# Patient Record
Sex: Female | Born: 1960 | Race: Black or African American | Hispanic: No | Marital: Married | Smoking: Never smoker
Health system: Southern US, Community
[De-identification: ages and names within clinical notes are randomized; demographics above are authoritative.]

## PROBLEM LIST (undated history)

## (undated) DIAGNOSIS — I10 Essential (primary) hypertension: Secondary | ICD-10-CM

## (undated) DIAGNOSIS — Z87442 Personal history of urinary calculi: Secondary | ICD-10-CM

## (undated) DIAGNOSIS — D271 Benign neoplasm of left ovary: Secondary | ICD-10-CM

## (undated) DIAGNOSIS — R0982 Postnasal drip: Secondary | ICD-10-CM

## (undated) DIAGNOSIS — N200 Calculus of kidney: Secondary | ICD-10-CM

## (undated) DIAGNOSIS — J309 Allergic rhinitis, unspecified: Secondary | ICD-10-CM

## (undated) DIAGNOSIS — G510 Bell's palsy: Secondary | ICD-10-CM

## (undated) HISTORY — DX: Personal history of urinary calculi: Z87.442

## (undated) HISTORY — DX: Allergic rhinitis, unspecified: J30.9

## (undated) HISTORY — PX: ABDOMINAL HYSTERECTOMY: SHX81

## (undated) HISTORY — PX: AUGMENTATION MAMMAPLASTY: SUR837

## (undated) HISTORY — DX: Essential (primary) hypertension: I10

## (undated) HISTORY — DX: Postnasal drip: R09.82

## (undated) HISTORY — DX: Benign neoplasm of left ovary: D27.1

## (undated) HISTORY — DX: Bell's palsy: G51.0

---

## 2014-09-14 DIAGNOSIS — G510 Bell's palsy: Secondary | ICD-10-CM | POA: Insufficient documentation

## 2014-09-14 DIAGNOSIS — Z8669 Personal history of other diseases of the nervous system and sense organs: Secondary | ICD-10-CM | POA: Insufficient documentation

## 2014-09-14 HISTORY — DX: Bell's palsy: G51.0

## 2015-06-22 ENCOUNTER — Ambulatory Visit
Admission: RE | Admit: 2015-06-22 | Discharge: 2015-06-22 | Disposition: A | Discharge status: Home / Self Care | Payer: 59 | Source: Ambulatory Visit | Attending: Family Medicine | Admitting: Family Medicine

## 2015-06-22 ENCOUNTER — Ambulatory Visit (INDEPENDENT_AMBULATORY_CARE_PROVIDER_SITE_OTHER): Payer: 59 | Admitting: Family Medicine

## 2015-06-22 ENCOUNTER — Encounter: Payer: Self-pay | Admitting: Family Medicine

## 2015-06-22 VITALS — BP 122/78 | HR 83 | Temp 98.2°F | Resp 16 | Ht 67.5 in | Wt 133.4 lb

## 2015-06-22 DIAGNOSIS — M79672 Pain in left foot: Secondary | ICD-10-CM

## 2015-06-22 DIAGNOSIS — M79671 Pain in right foot: Secondary | ICD-10-CM

## 2015-06-22 DIAGNOSIS — R0982 Postnasal drip: Secondary | ICD-10-CM

## 2015-06-22 DIAGNOSIS — D279 Benign neoplasm of unspecified ovary: Secondary | ICD-10-CM | POA: Insufficient documentation

## 2015-06-22 DIAGNOSIS — I10 Essential (primary) hypertension: Secondary | ICD-10-CM | POA: Diagnosis not present

## 2015-06-22 DIAGNOSIS — Z87442 Personal history of urinary calculi: Secondary | ICD-10-CM | POA: Insufficient documentation

## 2015-06-22 DIAGNOSIS — J309 Allergic rhinitis, unspecified: Secondary | ICD-10-CM | POA: Insufficient documentation

## 2015-06-22 HISTORY — DX: Essential (primary) hypertension: I10

## 2015-06-22 HISTORY — DX: Benign neoplasm of unspecified ovary: D27.9

## 2015-06-22 MED ORDER — IBUPROFEN 800 MG PO TABS: 800.0000 mg | ORAL_TABLET | Freq: Three times a day (TID) | ORAL | Status: DC | PRN

## 2015-06-22 MED ORDER — TRAMADOL HCL 50 MG PO TABS: 50.0000 mg | ORAL_TABLET | Freq: Two times a day (BID) | ORAL | Status: DC | PRN

## 2015-06-22 NOTE — Patient Instructions (Signed)
Morton's Neuroma in Sports  (Interdigital Plantar Neuroma) Morton's neuroma is a condition of the nervous system that results in pain or loss of feeling in the toes. The disease is caused by the bones of the foot squeezing the nerve that runs between two toes (interdigital nerve). The third and fourth toes are most likely to be affected by this disease. SYMPTOMS   Tingling, numbness, burning, or electric shocks in the front of the foot, often involving the third and fourth toes, although it may involve any other pair of toes.  Pain and tenderness in the front of the foot, that gets worse when walking.  Pain that gets worse when pressure is applied to the foot (wearing shoes).  Severe pain in the front of the foot, when standing on the front of the foot (on tiptoes), such as with running, jumping, pivoting, or dancing. CAUSES  Morton's neuroma is caused by swelling of the nerve between two toes. This swelling causes the nerve to be pinched between the bones of the foot. RISK INCREASES WITH:  Recurring foot or ankle injuries.  Poor fitting or worn shoes, with minimal padding and shock absorbers.  Loose ligaments of the foot, causing thickening of the nerve.  Poor foot strength and flexibility. PREVENTION  Warm up and stretch properly before activity.  Maintain physical fitness:  Foot and ankle flexibility.  Muscle strength and endurance.  Cardiovascular fitness.  Wear properly fitted and padded shoes.  Wear arch supports (orthotics), when needed. PROGNOSIS  If treated properly, Morton's neuroma can usually be cured with non-surgical treatment. For certain cases, surgery may be needed. RELATED COMPLICATIONS  Permanent numbness and pain in the foot.  Inability to participate in athletics, because of pain. TREATMENT Treatment first involves stopping any activities that make the symptoms worse. The use of ice and medicine will help reduce pain and inflammation. Wearing shoes  with a wide toe box, and an orthotic arch support or metatarsal bar, may also reduce pain. Your caregiver may give you a corticosteroid injection, to further reduce inflammation. If non-surgical treatment is unsuccessful, surgery may be needed. Surgery to fix Morton's neuroma is often performed as an outpatient procedure, meaning you can go home the same day as the surgery. The procedure involves removing the source of pressure on the nerve. If it is necessary to remove the nerve, you can expect persistent numbness. MEDICATION  If pain medicine is needed, nonsteroidal anti-inflammatory medicines (aspirin and ibuprofen), or other minor pain relievers (acetaminophen), are often advised.  Do not take pain medicine for 7 days before surgery.  Prescription pain relievers are usually prescribed only after surgery. Use only as directed and only as much as you need.  Corticosteroid injections are used in extreme cases, to reduce inflammation. These injections should be done only if necessary, because they may be given only a limited number of times. HEAT AND COLD  Cold treatment (icing) should be applied for 10 to 15 minutes every 2 to 3 hours for inflammation and pain, and immediately after activity that aggravates your symptoms. Use ice packs or an ice massage.  Heat treatment may be used before performing stretching and strengthening activities prescribed by your caregiver, physical therapist, or athletic trainer. Use a heat pack or a warm water soak. SEEK MEDICAL CARE IF:   Symptoms get worse or do not improve in 2 weeks, despite treatment.  After surgery you develop increasing pain, swelling, redness, increased warmth, bleeding, drainage of fluids, or fever.  New, unexplained symptoms develop. (  Drugs used in treatment may produce side effects.) Document Released: 08/28/2005 Document Revised: 01/13/2012 Document Reviewed: 02/02/2009 [employer] Patient Information 2015 [employer]. This  information is not intended to replace advice given to you by your health care provider. Make sure you discuss any questions you have with your health care provider.  

## 2015-06-22 NOTE — Progress Notes (Signed)
Name: Teresa Dodson   MRN: [id number]    DOB: November 27, 1960   Date:06/22/2015       Progress Note  Subjective  Chief Complaint  Chief Complaint  Patient presents with  . Foot Pain    patient stated that she has bilateral foot pain that has been goin on for quite some time. She stated it feels like something is stabbing her in the ball of her foot. Patient has not taken anything for the pain.    HPI  Patient is here for routine follow up of Hypertension. First diagnosed with hypertension several years ago. Current anti-hypertension medication regimen includes dietary modification, weight management and Lotrel 5-10 mg one a day. Patient is following physician recommended management. Not checking blood pressure outside of physician office. Results average systolic unknown and average diastolic unknown. Associated symptoms do not include headache, dizziness, nausea, lower extremity swelling, shortness of breath, chest pain, numbness.  Joint/Muscle Pain: Patient complains of bilateral foot pain for which has been present for several months. Pain is located in base of both feet at the balls of the feet, is described as aching and sharp, and is intermittent .  Associated symptoms include: none.  The patient has tried nothing for pain relief.  Related to injury:   No.  She wears supportive slippers or flats mostly, no high heels. Has not changed activity recently but overall keeps health and is active.    Past Medical History  Diagnosis Date  . Cystadenofibroma of left ovary   . Left-sided Bell's palsy   . HTN, goal below 140/90   . Allergic rhinitis with postnasal drip   . History of nephrolithiasis     Past Surgical History  Procedure Laterality Date  . Abdominal hysterectomy      vaginal; partial    Family History  Problem Relation Age of Onset  . Alzheimer's disease Mother   . Cancer Father     lung  . Diabetes Paternal Grandmother     Social History   Social History  .  Marital Status: Married    Spouse Name: N/A  . Number of Children: N/A  . Years of Education: N/A   Occupational History  . Not on file.   Social History Main Topics  . Smoking status: Never Smoker   . Smokeless tobacco: Not on file  . Alcohol Use: No  . Drug Use: No  . Sexual Activity:    Partners: Male   Other Topics Concern  . Not on file   Social History Narrative  . No narrative on file     Current outpatient prescriptions:  .  amLODipine-benazepril (LOTREL) 5-10 MG per capsule, TK 1 C PO D, Disp: , Rfl: 1 .  ibuprofen (ADVIL,MOTRIN) 800 MG tablet, Take 1 tablet (800 mg total) by mouth every 8 (eight) hours as needed., Disp: 30 tablet, Rfl: 1 .  traMADol (ULTRAM) 50 MG tablet, Take 1 tablet (50 mg total) by mouth every 12 (twelve) hours as needed for severe pain., Disp: 30 tablet, Rfl: 0  Allergies  Allergen Reactions  . Sulfamethoxazole-Trimethoprim Rash     ROS  CONSTITUTIONAL: No significant weight changes, fever, chills, weakness or fatigue.  HEENT:  - Eyes: No visual changes.  - Ears: No auditory changes. No pain.  - Nose: No sneezing, congestion, runny nose. - Throat: No sore throat. No changes in swallowing. SKIN: No rash or itching.  CARDIOVASCULAR: No chest pain, chest pressure or chest discomfort. No palpitations or edema.  RESPIRATORY:  No shortness of breath, cough or sputum.  GASTROINTESTINAL: No anorexia, nausea, vomiting. No changes in bowel habits. No abdominal pain or blood.  GENITOURINARY: No dysuria. No frequency. No discharge.  NEUROLOGICAL: No headache, dizziness, syncope, paralysis, ataxia, numbness or tingling in the extremities. No memory changes. No change in bowel or bladder control.  MUSCULOSKELETAL: Yes foot pain. No muscle pain. HEMATOLOGIC: No anemia, bleeding or bruising.  LYMPHATICS: No enlarged lymph nodes.  PSYCHIATRIC: No change in mood. No change in sleep pattern.  ENDOCRINOLOGIC: No reports of sweating, cold or heat  intolerance. No polyuria or polydipsia.   Objective  Filed Vitals:   06/22/15 1350  BP: 122/78  Pulse: 83  Temp: 98.2 F (36.8 C)  TempSrc: Oral  Resp: 16  Height: 5' 7.5" (1.715 m)  Weight: 133 lb 6.4 oz (60.51 kg)  SpO2: 98%   Body mass index is 20.57 kg/(m^2).  Physical Exam  Constitutional: Patient appears well-developed and well-nourished. In no distress.  HEENT:  - Head: Normocephalic and atraumatic.  - Ears: Bilateral TMs gray, no erythema or effusion - Nose: Nasal mucosa moist - Mouth/Throat: Oropharynx is clear and moist. No tonsillar hypertrophy or erythema. No post nasal drainage.  - Eyes: Conjunctivae clear, EOM movements normal. PERRLA. No scleral icterus.  Neck: Normal range of motion. Neck supple. No JVD present. No thyromegaly present.  Cardiovascular: Normal rate, regular rhythm and normal heart sounds.  No murmur heard.  Pulmonary/Chest: Effort normal and breath sounds normal. No respiratory distress. Musculoskeletal: Normal range of motion bilateral UE and LE, no joint effusions. Bilateral feet inspected, good tone, good arch support, plantar flexion and dorsi flexion and eversion and inversion intact with no pain, no pain at balls of feet. Pain reproduced at deep palpation in between 2nd and 3rd metatarsals however no masses or cysts felt.  Peripheral vascular: Bilateral LE no edema. Neurological: CN II-XII grossly intact with no focal deficits. Alert and oriented to person, place, and time. Coordination, balance, strength, speech and gait are normal.  Skin: Skin is warm and dry. No rash noted. No erythema.  Psychiatric: Patient has a normal mood and affect. Behavior is normal in office today. Judgment and thought content normal in office today.   Assessment & Plan  1. Hypertension, goal below 140/90 Clinically stable findings based on clinical exam and on review of any pertinent results. Recommended to patient that they continue their current regimen with  regular follow ups.   2. 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 Will get bilateral x-rays and instructed patients on home care, pdf and information provided on exercises.   - ibuprofen (ADVIL,MOTRIN) 800 MG tablet; Take 1 tablet (800 mg total) by mouth every 8 (eight) hours as needed.  Dispense: 30 tablet; Refill: 1 - traMADol (ULTRAM) 50 MG tablet; Take 1 tablet (50 mg total) by mouth every 12 (twelve) hours as needed for severe pain.  Dispense: 30 tablet; Refill: 0 - DG Foot Complete Left; Future - DG Foot Complete Right; Future

## 2015-08-25 ENCOUNTER — Telehealth: Payer: Self-pay | Admitting: Family Medicine

## 2015-08-25 NOTE — Telephone Encounter (Signed)
Pt is wanting to know if she can take 800 mg of tylenol. She is a pt of [facility] and she went to the urgent care and they told her that she has Calcium in her Rotar Cuff (shoulder) and the pain meds that they gave her are not working.

## 2015-08-25 NOTE — Telephone Encounter (Signed)
Spoke with pt

## 2015-08-30 ENCOUNTER — Telehealth: Payer: Self-pay | Admitting: Family Medicine

## 2015-08-30 NOTE — Telephone Encounter (Signed)
Patient went to urgent care and was told that she had calcific rotator cuff. She was prescribed prednisone and tramadol. Would like to know if you would like for her to schedule an appointment with you or if you would like for her to finish off the medication and then schedule appointment. Please advise.

## 2015-08-30 NOTE — Telephone Encounter (Signed)
Yes finish medication and follow up if symptoms persist.

## 2015-08-31 NOTE — Telephone Encounter (Signed)
Message was relayed to patient for her to finish the medication and then make appointment with dr Nadine Counts if it is not better. Patient understood.

## 2015-10-09 ENCOUNTER — Other Ambulatory Visit: Payer: Self-pay

## 2015-10-09 MED ORDER — AMLODIPINE BESY-BENAZEPRIL HCL 5-10 MG PO CAPS: 1.0000 | ORAL_CAPSULE | Freq: Every day | ORAL | Status: DC

## 2015-10-09 NOTE — Telephone Encounter (Signed)
Got a fax from [facility] on [address]. Requesting a refill of this patient's Lotrel, #90.   Refill request was sent to Dr. Bobetta Lime for approval and submission.

## 2016-01-03 ENCOUNTER — Encounter: Payer: Self-pay | Admitting: Family Medicine

## 2016-01-03 ENCOUNTER — Ambulatory Visit (INDEPENDENT_AMBULATORY_CARE_PROVIDER_SITE_OTHER): Payer: BC Managed Care – PPO | Admitting: Family Medicine

## 2016-01-03 VITALS — BP 118/68 | HR 71 | Temp 98.2°F | Resp 18 | Ht 68.0 in | Wt 134.5 lb

## 2016-01-03 DIAGNOSIS — I10 Essential (primary) hypertension: Secondary | ICD-10-CM | POA: Diagnosis not present

## 2016-01-03 DIAGNOSIS — Z124 Encounter for screening for malignant neoplasm of cervix: Secondary | ICD-10-CM | POA: Diagnosis not present

## 2016-01-03 DIAGNOSIS — Z9882 Breast implant status: Secondary | ICD-10-CM

## 2016-01-03 DIAGNOSIS — Z1231 Encounter for screening mammogram for malignant neoplasm of breast: Secondary | ICD-10-CM | POA: Diagnosis not present

## 2016-01-03 DIAGNOSIS — Z136 Encounter for screening for cardiovascular disorders: Secondary | ICD-10-CM

## 2016-01-03 DIAGNOSIS — Z13220 Encounter for screening for lipoid disorders: Secondary | ICD-10-CM | POA: Diagnosis not present

## 2016-01-03 DIAGNOSIS — Z0000 Encounter for general adult medical examination without abnormal findings: Secondary | ICD-10-CM | POA: Diagnosis not present

## 2016-01-03 DIAGNOSIS — Z23 Encounter for immunization: Secondary | ICD-10-CM | POA: Insufficient documentation

## 2016-01-03 DIAGNOSIS — Z1211 Encounter for screening for malignant neoplasm of colon: Secondary | ICD-10-CM | POA: Insufficient documentation

## 2016-01-03 DIAGNOSIS — IMO0001 Reserved for inherently not codable concepts without codable children: Secondary | ICD-10-CM | POA: Insufficient documentation

## 2016-01-03 DIAGNOSIS — Z113 Encounter for screening for infections with a predominantly sexual mode of transmission: Secondary | ICD-10-CM

## 2016-01-03 HISTORY — DX: Breast implant status: Z98.82

## 2016-01-03 NOTE — Progress Notes (Signed)
Name: Teresa Dodson   MRN: [id number]    DOB: [birth year]   Date:01/03/2016       Progress Note  Subjective  Chief Complaint  Chief Complaint  Patient presents with  . Annual Exam    HPI  Patient is here today for a Complete Female Physical Exam:  The patient has has no unusual complaints. Overall feels healthy. Diet is well balanced. In general does exercise regularly. Sees dentist regularly and addresses vision concerns with ophthalmologist if applicable. In regards to sexual activity the patient is currently sexually active. Currently is not concerned about exposure to any STDs.   First diagnosed with hypertension several years ago. Current anti-hypertension medication regimen includes dietary modification, weight management and Lotrel 5-10 mg one a day. Patient is following physician recommended management. Not checking blood pressure outside of physician office. Results average systolic unknown and average diastolic unknown. Associated symptoms do not include headache, dizziness, nausea, lower extremity swelling, shortness of breath, chest pain, numbness.  Has residual left facial weakness from Bell's palsy flare up some years ago. Noted some improvement in sensation and less droop in the area.   Past Medical History  Diagnosis Date  . Cystadenofibroma of left ovary   . Left-sided Bell's palsy   . HTN, goal below 140/90   . Allergic rhinitis with postnasal drip   . History of nephrolithiasis     Past Surgical History  Procedure Laterality Date  . Abdominal hysterectomy      vaginal; partial    Family History  Problem Relation Age of Onset  . Alzheimer's disease Mother   . Cancer Father     lung  . Diabetes Paternal Grandmother     Social History   Social History  . Marital Status: Married    Spouse Name: N/A  . Number of Children: N/A  . Years of Education: N/A   Occupational History  . Not on file.   Social History Main Topics  . Smoking status: Never  Smoker   . Smokeless tobacco: Not on file  . Alcohol Use: No  . Drug Use: No  . Sexual Activity:    Partners: Male   Other Topics Concern  . Not on file   Social History Narrative     Current outpatient prescriptions:  .  amLODipine-benazepril (LOTREL) 5-10 MG capsule, Take 1 capsule by mouth daily., Disp: 90 capsule, Rfl: 2 .  ibuprofen (ADVIL,MOTRIN) 800 MG tablet, Take 1 tablet (800 mg total) by mouth every 8 (eight) hours as needed., Disp: 30 tablet, Rfl: 1  Allergies  Allergen Reactions  . Sulfamethoxazole-Trimethoprim Rash    ROS  CONSTITUTIONAL: No significant weight changes, fever, chills, weakness or fatigue.  HEENT:  - Eyes: No visual changes.  - Ears: No auditory changes. No pain.  - Nose: No sneezing, congestion, runny nose. - Throat: No sore throat. No changes in swallowing. SKIN: No rash or itching.  CARDIOVASCULAR: No chest pain, chest pressure or chest discomfort. No palpitations or edema.  RESPIRATORY: No shortness of breath, cough or sputum.  GASTROINTESTINAL: No anorexia, nausea, vomiting. No changes in bowel habits. No abdominal pain or blood.  GENITOURINARY: No dysuria. No frequency. No discharge.  NEUROLOGICAL: No headache, dizziness, syncope, paralysis, ataxia, numbness or tingling in the extremities. No memory changes. No change in bowel or bladder control.  MUSCULOSKELETAL: No joint pain. No muscle pain. HEMATOLOGIC: No anemia, bleeding or bruising.  LYMPHATICS: No enlarged lymph nodes.  PSYCHIATRIC: No change in mood. No change in sleep  pattern.  ENDOCRINOLOGIC: No reports of sweating, cold or heat intolerance. No polyuria or polydipsia.   Objective  Filed Vitals:   01/03/16 1046  BP: 118/68  Pulse: 71  Temp: 98.2 F (36.8 C)  Resp: 18  Height: 5\' 8"  (1.727 m)  Weight: 134 lb 8 oz (61.009 kg)  SpO2: 98%   Body mass index is 20.46 kg/(m^2).  Depression screen [facility] 2/9 01/03/2016 06/22/2015  Decreased Interest 0 0  Down, Depressed,  Hopeless 0 0  PHQ - 2 Score 0 0     Physical Exam  Constitutional: Patient appears well-developed and well-nourished. In no distress.  HEENT:  - Head: Normocephalic and atraumatic. Left facial droop around eyebrow and upper cheek only notable when large smile.  - Ears: Bilateral TMs gray, no erythema or effusion - Nose: Nasal mucosa moist - Mouth/Throat: Oropharynx is clear and moist. No tonsillar hypertrophy or erythema. No post nasal drainage.  - Eyes: Conjunctivae clear, EOM movements normal. PERRLA. No scleral icterus.  Neck: Normal range of motion. Neck supple. No JVD present. No thyromegaly present.  Cardiovascular: Normal rate, regular rhythm and normal heart sounds.  No murmur heard.  Pulmonary/Chest: Effort normal and breath sounds normal. No respiratory distress. Abdominal: Soft. Bowel sounds are normal, no distension. There is no tenderness. no masses BREAST: Bilateral breast exam, notable for saline implants, with no masses, skin changes or nipple discharge FEMALE GENITALIA:  External genitalia normal External urethra normal Vaginal vault normal without discharge or lesions Cervix normal without discharge or lesions Bimanual exam normal without masses RECTAL: no rectal masses or hemorrhoids Musculoskeletal: Normal range of motion bilateral UE and LE, no joint effusions. Peripheral vascular: Bilateral LE no edema. Neurological: CN II-XII grossly intact with no focal deficits other than Left facial droop around eyebrow and upper cheek only notable when large smile (slight improved from previous year). Alert and oriented to person, place, and time. Coordination, balance, strength, speech and gait are normal.  Skin: Skin is warm and dry. No rash noted. No erythema.  Psychiatric: Patient has a normal mood and affect. Behavior is normal in office today. Judgment and thought content normal in office today.   Assessment & Plan  1. Annual physical exam Discussed in detail all  recommended preventative measures appropriate for age and gender now and in the future.   2. Need for influenza vaccination  - Flu Vaccine QUAD 36+ mos PF IM (Fluarix & Fluzone Quad PF)  3. Hypertension, goal below 140/90 Well controled, should have enough refills from previous refill.  - CBC with Differential/Platelet - Comprehensive metabolic panel - Lipid panel - TSH  4. Encounter for cholesteral screening for cardiovascular disease   5. Screening for STD (sexually transmitted disease)  - HIV antibody - Hepatitis C antibody  6. Encounter for screening mammogram for malignant neoplasm of breast  - MM Digital Screening; Future  7. Encounter for screening for malignant neoplasm of cervix  - Pap IG w/ reflex to HPV when ASC-U  8. Encounter for screening for malignant neoplasm of colon She opted for cologuard  - Cologuard  9. History of bilateral saline breast implants No complications

## 2016-01-04 LAB — CBC WITH DIFFERENTIAL/PLATELET
BASOS ABS: 0 10*3/uL (ref 0.0–0.2)
Basos: 0 %
EOS (ABSOLUTE): 0.1 10*3/uL (ref 0.0–0.4)
EOS: 2 %
HEMOGLOBIN: 14.3 g/dL (ref 11.1–15.9)
Hematocrit: 42.3 % (ref 34.0–46.6)
IMMATURE GRANS (ABS): 0 10*3/uL (ref 0.0–0.1)
IMMATURE GRANULOCYTES: 0 %
LYMPHS: 33 %
Lymphocytes Absolute: 1.4 10*3/uL (ref 0.7–3.1)
MCH: 30.1 pg (ref 26.6–33.0)
MCHC: 33.8 g/dL (ref 31.5–35.7)
MCV: 89 fL (ref 79–97)
MONOCYTES: 7 %
Monocytes Absolute: 0.3 10*3/uL (ref 0.1–0.9)
Neutrophils Absolute: 2.4 10*3/uL (ref 1.4–7.0)
Neutrophils: 58 %
Platelets: 207 10*3/uL (ref 150–379)
RBC: 4.75 x10E6/uL (ref 3.77–5.28)
RDW: 13.1 % (ref 12.3–15.4)
WBC: 4.1 10*3/uL (ref 3.4–10.8)

## 2016-01-04 LAB — COMPREHENSIVE METABOLIC PANEL
ALBUMIN: 4.7 g/dL (ref 3.5–5.5)
ALK PHOS: 55 IU/L (ref 39–117)
ALT: 14 IU/L (ref 0–32)
AST: 22 IU/L (ref 0–40)
Albumin/Globulin Ratio: 1.7 (ref 1.1–2.5)
BUN / CREAT RATIO: 16 (ref 9–23)
BUN: 15 mg/dL (ref 6–24)
Bilirubin Total: 0.6 mg/dL (ref 0.0–1.2)
CALCIUM: 9.6 mg/dL (ref 8.7–10.2)
CO2: 28 mmol/L (ref 18–29)
CREATININE: 0.93 mg/dL (ref 0.57–1.00)
Chloride: 100 mmol/L (ref 96–106)
GFR calc Af Amer: 80 mL/min/{1.73_m2} (ref >59)
GFR, EST NON AFRICAN AMERICAN: 69 mL/min/{1.73_m2} (ref >59)
GLUCOSE: 84 mg/dL (ref 65–99)
Globulin, Total: 2.8 g/dL (ref 1.5–4.5)
Potassium: 4.3 mmol/L (ref 3.5–5.2)
Sodium: 140 mmol/L (ref 134–144)
TOTAL PROTEIN: 7.5 g/dL (ref 6.0–8.5)

## 2016-01-04 LAB — LIPID PANEL
CHOLESTEROL TOTAL: 221 mg/dL — AB (ref 100–199)
Chol/HDL Ratio: 2.4 ratio units (ref 0.0–4.4)
HDL: 91 mg/dL (ref >39)
LDL CALC: 117 mg/dL — AB (ref 0–99)
Triglycerides: 64 mg/dL (ref 0–149)
VLDL CHOLESTEROL CAL: 13 mg/dL (ref 5–40)

## 2016-01-04 LAB — TSH: TSH: 1.67 u[IU]/mL (ref 0.450–4.500)

## 2016-01-04 LAB — HIV ANTIBODY (ROUTINE TESTING W REFLEX): HIV SCREEN 4TH GENERATION: NONREACTIVE

## 2016-01-04 LAB — HEPATITIS C ANTIBODY: Hep C Virus Ab: <0.1 s/co ratio (ref 0.0–0.9)

## 2016-01-06 LAB — PAP IG W/ RFLX HPV ASCU: PAP Smear Comment: 0

## 2016-01-19 ENCOUNTER — Telehealth: Payer: Self-pay | Admitting: Family Medicine

## 2016-01-19 NOTE — Telephone Encounter (Signed)
Received fax on 01/17/16 from [facility] stating that patient refused to complete the cologuard screening.  Therefore the order has been canceled.  If, at a later date, Teresa Dodson returns her kit, [facility] will reactivate this order.  The order can be reactivated within 365 of the initial order. °

## 2016-08-12 ENCOUNTER — Other Ambulatory Visit: Payer: Self-pay

## 2016-08-12 MED ORDER — AMLODIPINE BESY-BENAZEPRIL HCL 5-10 MG PO CAPS: 1.0000 | ORAL_CAPSULE | Freq: Every day | ORAL | 1 refills | Status: DC

## 2016-08-12 NOTE — Telephone Encounter (Signed)
Last Cr and K+ reviewed; Rx approved 

## 2016-08-19 ENCOUNTER — Ambulatory Visit (INDEPENDENT_AMBULATORY_CARE_PROVIDER_SITE_OTHER): Payer: PRIVATE HEALTH INSURANCE

## 2016-08-19 DIAGNOSIS — Z23 Encounter for immunization: Secondary | ICD-10-CM

## 2016-09-21 ENCOUNTER — Emergency Department: Payer: PRIVATE HEALTH INSURANCE

## 2016-09-21 ENCOUNTER — Emergency Department
Admission: EM | Admit: 2016-09-21 | Discharge: 2016-09-21 | Disposition: A | Discharge status: Home / Self Care | Payer: PRIVATE HEALTH INSURANCE | Attending: Emergency Medicine | Admitting: Emergency Medicine

## 2016-09-21 DIAGNOSIS — I10 Essential (primary) hypertension: Secondary | ICD-10-CM | POA: Insufficient documentation

## 2016-09-21 DIAGNOSIS — Z791 Long term (current) use of non-steroidal anti-inflammatories (NSAID): Secondary | ICD-10-CM | POA: Diagnosis not present

## 2016-09-21 DIAGNOSIS — N202 Calculus of kidney with calculus of ureter: Secondary | ICD-10-CM | POA: Diagnosis not present

## 2016-09-21 DIAGNOSIS — R109 Unspecified abdominal pain: Secondary | ICD-10-CM | POA: Diagnosis present

## 2016-09-21 DIAGNOSIS — Z79899 Other long term (current) drug therapy: Secondary | ICD-10-CM | POA: Insufficient documentation

## 2016-09-21 DIAGNOSIS — N200 Calculus of kidney: Secondary | ICD-10-CM

## 2016-09-21 DIAGNOSIS — N201 Calculus of ureter: Secondary | ICD-10-CM

## 2016-09-21 HISTORY — DX: Calculus of kidney: N20.0

## 2016-09-21 LAB — URINALYSIS COMPLETE WITH MICROSCOPIC (ARMC ONLY)
BILIRUBIN URINE: NEGATIVE
GLUCOSE, UA: 50 mg/dL — AB
Hgb urine dipstick: NEGATIVE
KETONES UR: NEGATIVE mg/dL
Leukocytes, UA: NEGATIVE
NITRITE: NEGATIVE
Protein, ur: NEGATIVE mg/dL
SPECIFIC GRAVITY, URINE: 1.009 (ref 1.005–1.030)
pH: 9 — ABNORMAL HIGH (ref 5.0–8.0)

## 2016-09-21 LAB — COMPREHENSIVE METABOLIC PANEL
ALBUMIN: 4.6 g/dL (ref 3.5–5.0)
ALK PHOS: 51 U/L (ref 38–126)
ALT: 25 U/L (ref 14–54)
ANION GAP: 7 (ref 5–15)
AST: 33 U/L (ref 15–41)
BILIRUBIN TOTAL: 0.5 mg/dL (ref 0.3–1.2)
BUN: 18 mg/dL (ref 6–20)
CALCIUM: 9.5 mg/dL (ref 8.9–10.3)
CO2: 29 mmol/L (ref 22–32)
CREATININE: 1.1 mg/dL — AB (ref 0.44–1.00)
Chloride: 105 mmol/L (ref 101–111)
GFR calc non Af Amer: 55 mL/min — ABNORMAL LOW (ref >60)
GLUCOSE: 158 mg/dL — AB (ref 65–99)
Potassium: 3.9 mmol/L (ref 3.5–5.1)
SODIUM: 141 mmol/L (ref 135–145)
TOTAL PROTEIN: 7.8 g/dL (ref 6.5–8.1)

## 2016-09-21 LAB — CBC
HCT: 42.9 % (ref 35.0–47.0)
HEMOGLOBIN: 14.4 g/dL (ref 12.0–16.0)
MCH: 29.9 pg (ref 26.0–34.0)
MCHC: 33.5 g/dL (ref 32.0–36.0)
MCV: 89.1 fL (ref 80.0–100.0)
PLATELETS: 241 10*3/uL (ref 150–440)
RBC: 4.82 MIL/uL (ref 3.80–5.20)
RDW: 13.1 % (ref 11.5–14.5)
WBC: 12.2 10*3/uL — ABNORMAL HIGH (ref 3.6–11.0)

## 2016-09-21 LAB — LIPASE, BLOOD: Lipase: 21 U/L (ref 11–51)

## 2016-09-21 MED ORDER — ONDANSETRON HCL 4 MG/2ML IJ SOLN
INTRAMUSCULAR | Status: AC
  Administered 2016-09-21: 4 mg via INTRAVENOUS
  Filled 2016-09-21: qty 2

## 2016-09-21 MED ORDER — NAPROXEN 500 MG PO TABS: 500.0000 mg | ORAL_TABLET | Freq: Two times a day (BID) | ORAL | 0 refills | Status: DC

## 2016-09-21 MED ORDER — MORPHINE SULFATE (PF) 4 MG/ML IV SOLN
4.0000 mg | Freq: Once | INTRAVENOUS | Status: AC
  Administered 2016-09-21: 4 mg via INTRAVENOUS

## 2016-09-21 MED ORDER — ONDANSETRON HCL 4 MG/2ML IJ SOLN
4.0000 mg | Freq: Once | INTRAMUSCULAR | Status: AC
  Administered 2016-09-21: 4 mg via INTRAVENOUS
  Filled 2016-09-21: qty 2

## 2016-09-21 MED ORDER — OXYCODONE-ACETAMINOPHEN 5-325 MG PO TABS: 1.0000 | ORAL_TABLET | Freq: Four times a day (QID) | ORAL | 0 refills | Status: DC | PRN

## 2016-09-21 MED ORDER — ONDANSETRON HCL 4 MG/2ML IJ SOLN
4.0000 mg | Freq: Once | INTRAMUSCULAR | Status: AC
  Administered 2016-09-21: 4 mg via INTRAVENOUS

## 2016-09-21 MED ORDER — ONDANSETRON 4 MG PO TBDP: 4.0000 mg | ORAL_TABLET | Freq: Three times a day (TID) | ORAL | 0 refills | Status: DC | PRN

## 2016-09-21 MED ORDER — MORPHINE SULFATE (PF) 4 MG/ML IV SOLN
INTRAVENOUS | Status: AC
  Administered 2016-09-21: 4 mg via INTRAVENOUS
  Filled 2016-09-21: qty 1

## 2016-09-21 MED ORDER — FENTANYL CITRATE (PF) 100 MCG/2ML IJ SOLN
50.0000 ug | INTRAMUSCULAR | Status: DC | PRN
  Administered 2016-09-21: 50 ug via INTRAVENOUS
  Filled 2016-09-21: qty 2

## 2016-09-21 MED ORDER — OXYCODONE-ACETAMINOPHEN 5-325 MG PO TABS
1.0000 | ORAL_TABLET | Freq: Once | ORAL | Status: AC
Start: 2016-09-21 — End: 2016-09-21
  Administered 2016-09-21: 1 via ORAL
  Filled 2016-09-21: qty 1

## 2016-09-21 NOTE — ED Triage Notes (Signed)
Right sided flank pain and urinary urgency 1 hour PTA. Pt holding right flank.

## 2016-09-21 NOTE — Discharge Instructions (Signed)
Your CT scan shows a 53mm kidney stone just outside the bladder on the right side.  This should pass soon, at which point your symptoms will significantly improve.  Continue taking medications to control pain and nausea as needed until then.

## 2016-09-21 NOTE — ED Provider Notes (Signed)
[facility] Emergency Department Provider Note  ____________________________________________  Time seen: Approximately 10:25 PM  I have reviewed the triage vital signs and the nursing notes.   HISTORY  Chief Complaint Flank Pain    HPI Teresa Dodson is a 55 y.o. female who complains of right flank pain radiating around to the right lower quadrant since 4 PM today. Started suddenly. Constant severe. Sharp. It feels like prior kidney stone. No fevers chills sweats dizziness syncope. No chest pain or shortness of breath. No aggravating or alleviating factors     Past Medical History:  Diagnosis Date  . Allergic rhinitis with postnasal drip   . Cystadenofibroma of left ovary   . History of nephrolithiasis   . HTN, goal below 140/90   . Kidney stone   . Left-sided Bell's palsy      Patient Active Problem List   Diagnosis Date Noted  . Need for influenza vaccination 01/03/2016  . Annual physical exam 01/03/2016  . Encounter for cholesteral screening for cardiovascular disease 01/03/2016  . Screening for STD (sexually transmitted disease) 01/03/2016  . Encounter for screening mammogram for malignant neoplasm of breast 01/03/2016  . History of bilateral saline breast implants 01/03/2016  . Encounter for screening for malignant neoplasm of colon 01/03/2016  . Encounter for screening for malignant neoplasm of cervix 01/03/2016  . 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 History of nephrolithiasis 06/22/2015  . Hypertension, goal below 140/90 06/22/2015  . Allergic rhinitis with postnasal drip 06/22/2015  . Foot pain, bilateral 06/22/2015  . History of Bell's palsy 09/14/2014     Past Surgical History:  Procedure Laterality Date  . ABDOMINAL HYSTERECTOMY     vaginal; partial     Prior to Admission medications   Medication Sig Start Date End Date Taking? Authorizing Provider  amLODipine-benazepril (LOTREL) 5-10 MG capsule Take 1 capsule by mouth  daily. 08/12/16   Arnetha Courser, MD  ibuprofen (ADVIL,MOTRIN) 800 MG tablet Take 1 tablet (800 mg total) by mouth every 8 (eight) hours as needed. 06/22/15   Bobetta Lime, MD  naproxen (NAPROSYN) 500 MG tablet Take 1 tablet (500 mg total) by mouth 2 (two) times daily with a meal. 09/21/16   Carrie Mew, MD  ondansetron (ZOFRAN ODT) 4 MG disintegrating tablet Take 1 tablet (4 mg total) by mouth every 8 (eight) hours as needed for nausea or vomiting. 09/21/16   Carrie Mew, MD  oxyCODONE-acetaminophen (ROXICET) 5-325 MG tablet Take 1 tablet by mouth every 6 (six) hours as needed for severe pain. 09/21/16   Carrie Mew, MD     Allergies Sulfamethoxazole-trimethoprim   Family History  Problem Relation Age of Onset  . Alzheimer's disease Mother   . Cancer Father     lung  . Diabetes Paternal Grandmother     Social History Social History  Substance Use Topics  . Smoking status: Never Smoker  . Smokeless tobacco: Never Used  . Alcohol use No    Review of Systems  Constitutional:   No fever or chills.  ENT:   No sore throat. No rhinorrhea. Cardiovascular:   No chest pain. Respiratory:   No dyspnea or cough. Gastrointestinal:   Right lower quadrant pain. Vomiting. No diarrhea.  Genitourinary:   Negative for dysuria or difficulty urinating. Musculoskeletal:   Negative for focal pain or swelling  10-point ROS otherwise negative.  ____________________________________________   PHYSICAL EXAM:  VITAL SIGNS: ED Triage Vitals  Enc Vitals Group     BP 09/21/16 1725 (!) 145/88  Pulse Rate 09/21/16 1725 63     Resp 09/21/16 1725 18     Temp 09/21/16 1725 97.7 F (36.5 C)     Temp Source 09/21/16 1725 Oral     SpO2 09/21/16 1725 100 %     Weight 09/21/16 1726 133 lb (60.3 kg)     Height 09/21/16 1726 5\' 7"  (1.702 m)     Head Circumference --      Peak Flow --      Pain Score 09/21/16 1726 10     Pain Loc --      Pain Edu? --      Excl. in [place]? --      Vital signs reviewed, nursing assessments reviewed.   Constitutional:   Alert and oriented. Uncomfortable but not in distress. Eyes:   No scleral icterus. No conjunctival pallor. PERRL. EOMI.  No nystagmus. ENT   Head:   Normocephalic and atraumatic.   Nose:   No congestion/rhinnorhea. No septal hematoma   Mouth/Throat:   MMM, no pharyngeal erythema. No peritonsillar mass.    Neck:   No stridor. No SubQ emphysema. No meningismus. Hematological/Lymphatic/Immunilogical:   No cervical lymphadenopathy. Cardiovascular:   RRR. Symmetric bilateral radial and DP pulses.  No murmurs.  Respiratory:   Normal respiratory effort without tachypnea nor retractions. Breath sounds are clear and equal bilaterally. No wheezes/rales/rhonchi. Gastrointestinal:   Soft and nontender. Non distended. There is no CVA tenderness.  No rebound, rigidity, or guarding. Genitourinary:   deferred Musculoskeletal:   Nontender with normal range of motion in all extremities. No joint effusions.  No lower extremity tenderness.  No edema. Neurologic:   Normal speech and language.  CN 2-10 normal. Motor grossly intact. No gross focal neurologic deficits are appreciated.  Skin:    Skin is warm, dry and intact. No rash noted.  No petechiae, purpura, or bullae.  ____________________________________________    LABS (pertinent positives/negatives) (all labs ordered are listed, but only abnormal results are displayed) Labs Reviewed  COMPREHENSIVE METABOLIC PANEL - Abnormal; Notable for the following:       Result Value   Glucose, Bld 158 (*)    Creatinine, Ser 1.10 (*)    GFR calc non Af Amer 55 (*)    All other components within normal limits  CBC - Abnormal; Notable for the following:    WBC 12.2 (*)    All other components within normal limits  URINALYSIS COMPLETEWITH MICROSCOPIC ([facility] ONLY) - Abnormal; Notable for the following:    Color, Urine COLORLESS (*)    APPearance CLEAR (*)    Glucose, UA  50 (*)    pH 9.0 (*)    Bacteria, UA RARE (*)    Squamous Epithelial / LPF 0-5 (*)    All other components within normal limits  LIPASE, BLOOD   ____________________________________________   EKG    ____________________________________________    RADIOLOGY  CT abdomen and pelvis shows 3 mm stone in the distal right ureter at the UVJ with some associated hydronephrosis and stranding  ____________________________________________   PROCEDURES Procedures  ____________________________________________   INITIAL IMPRESSION / ASSESSMENT AND PLAN / ED COURSE  Pertinent labs & imaging results that were available during my care of the patient were reviewed by me and considered in my medical decision making (see chart for details).  Patient well appearing not in distress, presents with symptoms consistent with renal colic. Vital signs unremarkable, exam unremarkable and reassuring. Abdomen is nonsurgical. The patient given morphine and Zofran for symptom  control pending workup. Workup does reveal a 3 mm stone at the right UVJ. No evidence of any complications, no urinary tract infection. On reassessment at 10:30 PM symptoms are controlled. With transition of Percocet and said Zofran, counseled on what to expect. Counseled on pain medicine precautions. Follow-up with primary care.     Clinical Course    ____________________________________________   FINAL CLINICAL IMPRESSION(S) / ED DIAGNOSES  Final diagnoses:  Ureterolithiasis  Kidney stone       Portions of this note were generated with dragon dictation software. Dictation errors may occur despite best attempts at proofreading.    Carrie Mew, MD 09/21/16 [phone]

## 2016-12-13 ENCOUNTER — Telehealth: Payer: Self-pay | Admitting: Family Medicine

## 2016-12-13 NOTE — Telephone Encounter (Signed)
Already updated

## 2016-12-13 NOTE — Telephone Encounter (Signed)
Pt no longer uses [facility] for her pharmacy. She now uses [facility] in [place].

## 2017-02-04 ENCOUNTER — Ambulatory Visit: Payer: PRIVATE HEALTH INSURANCE | Admitting: Family Medicine

## 2017-02-25 ENCOUNTER — Other Ambulatory Visit: Payer: Self-pay | Admitting: Family Medicine

## 2017-02-25 DIAGNOSIS — Z111 Encounter for screening for respiratory tuberculosis: Secondary | ICD-10-CM

## 2017-02-25 NOTE — Progress Notes (Signed)
Needs TB screen for work; order printed

## 2017-02-28 LAB — QUANTIFERON IN TUBE
QUANTIFERON MITOGEN VALUE: >10 IU/mL
QUANTIFERON TB AG VALUE: 0.06 [IU]/mL
QUANTIFERON TB GOLD: NEGATIVE
Quantiferon Nil Value: 0.08 IU/mL

## 2017-02-28 LAB — QUANTIFERON TB GOLD ASSAY (BLOOD)

## 2017-03-03 ENCOUNTER — Ambulatory Visit (INDEPENDENT_AMBULATORY_CARE_PROVIDER_SITE_OTHER): Payer: PRIVATE HEALTH INSURANCE | Admitting: Family Medicine

## 2017-03-03 ENCOUNTER — Encounter: Payer: Self-pay | Admitting: Family Medicine

## 2017-03-03 ENCOUNTER — Ambulatory Visit: Payer: PRIVATE HEALTH INSURANCE | Admitting: Family Medicine

## 2017-03-03 VITALS — BP 118/76 | HR 73 | Temp 98.0°F | Resp 14 | Wt 126.3 lb

## 2017-03-03 DIAGNOSIS — Z0184 Encounter for antibody response examination: Secondary | ICD-10-CM | POA: Diagnosis not present

## 2017-03-03 DIAGNOSIS — Z23 Encounter for immunization: Secondary | ICD-10-CM

## 2017-03-03 DIAGNOSIS — Z0289 Encounter for other administrative examinations: Secondary | ICD-10-CM

## 2017-03-03 DIAGNOSIS — Z1231 Encounter for screening mammogram for malignant neoplasm of breast: Secondary | ICD-10-CM

## 2017-03-03 DIAGNOSIS — Z1239 Encounter for other screening for malignant neoplasm of breast: Secondary | ICD-10-CM

## 2017-03-03 NOTE — Progress Notes (Signed)
BP 118/76   Pulse 73   Temp 98 F (36.7 C) (Oral)   Resp 14   Wt 126 lb 4.8 oz (57.3 kg)   SpO2 99%   BMI 19.78 kg/m    Subjective:    Patient ID: Teresa Dodson, female    DOB: Jul 26, 1961, 56 y.o.   MRN: [id number]  HPI: Teresa Dodson is a 56 y.o. female  Chief Complaint  Patient presents with  . 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 Allergic rhinitis with postnasal drip   . Cystadenofibroma of left ovary   . History of nephrolithiasis   . HTN, goal below 140/90   . Kidney stone   . Left-sided Bell's palsy    Past Surgical History:  Procedure Laterality Date  . ABDOMINAL HYSTERECTOMY     vaginal; partial   Social History  Substance Use Topics  . Smoking status: Never Smoker  . Smokeless tobacco: Never Used  . Alcohol use No   Interim medical history since last visit reviewed. Allergies and medications reviewed  Review of Systems Per HPI unless specifically indicated above     Objective:    BP 118/76   Pulse 73   Temp 98 F (36.7 C) (Oral)   Resp 14   Wt 126 lb 4.8 oz (57.3 kg)   SpO2 99%   BMI 19.78 kg/m   Wt Readings from Last 3 Encounters:  03/03/17 126 lb 4.8 oz (57.3 kg)  09/21/16 133 lb (60.3 kg)  01/03/16 134 lb 8 oz (61 kg)    Physical Exam  Constitutional: She appears well-developed and well-nourished. No distress.  Cardiovascular: Normal rate and regular rhythm.   Pulmonary/Chest: Effort normal and breath sounds normal.  Psychiatric: She has a normal mood and affect.      Assessment & Plan:    Problem List Items Addressed This Visit    None    Visit Diagnoses    Encounter for completion of form with patient    -  Primary   see copy of form, completed what I could, awaiting test results   Screening for breast cancer       mammo ordered   Relevant Orders   MM Digital Screening   Immunity status testing       check status of MMR, varicella, hepatitis B   Relevant Orders   Measles/Mumps/Rubella Immunity (Completed)   Hepatitis B surface antibody (Completed)   Varicella zoster antibody, IgG (Completed)   Hepatitis B surface antigen (Completed)   Need for diphtheria-tetanus-pertussis (Tdap) vaccine       given today   Relevant Orders   Tdap vaccine greater than or equal to 7yo IM (Completed)       Follow up plan: No Follow-up on file.  An after-visit summary was printed and given to the patient at [facility].  Please see the patient instructions which may contain other information and recommendations beyond what is mentioned above in the assessment and plan.  Meds ordered this encounter  Medications  .  cholecalciferol (VITAMIN D) 1000 units tablet    Sig: Take 1,000 Units by mouth daily.    Orders Placed This Encounter  Procedures  . MM Digital Screening  . Tdap vaccine greater than or equal to 7yo IM  . Measles/Mumps/Rubella Immunity  . Hepatitis B surface antibody  . Varicella zoster antibody, IgG  . Hepatitis B surface antigen   Face-to-face time with patient was more than 10 minutes, >50% time spent counseling and coordination of care

## 2017-03-03 NOTE — Patient Instructions (Signed)
You received the vaccine to protect against tetanus and diphtheria and pertussis today; the tetanus and diphtheria portions will provide protection up to ten years, and the pertussis component will give you protection against whooping cough for life You will need your next plain tetanus and diphtheria booster before March 04, 2027

## 2017-03-04 ENCOUNTER — Telehealth: Payer: Self-pay

## 2017-03-04 LAB — MEASLES/MUMPS/RUBELLA IMMUNITY
Mumps IgG: >300 AU/mL — ABNORMAL HIGH (ref <9.00)
RUBELLA: 29.2 {index} — AB (ref <0.90)
Rubeola IgG: >300 AU/mL — ABNORMAL HIGH (ref <25.00)

## 2017-03-04 LAB — HEPATITIS B SURFACE ANTIBODY,QUALITATIVE: Hep B S Ab: NEGATIVE

## 2017-03-04 LAB — VARICELLA ZOSTER ANTIBODY, IGG: VARICELLA IGG: 3491 {index} — AB (ref <135.00)

## 2017-03-04 LAB — HEPATITIS B SURFACE ANTIGEN: Hepatitis B Surface Ag: NEGATIVE

## 2017-03-04 NOTE — Telephone Encounter (Signed)
Informed pt forms are ready to pick up.

## 2017-03-27 ENCOUNTER — Other Ambulatory Visit: Payer: Self-pay | Admitting: Family Medicine

## 2017-03-27 DIAGNOSIS — Z5181 Encounter for therapeutic drug level monitoring: Secondary | ICD-10-CM | POA: Insufficient documentation

## 2017-03-27 NOTE — Assessment & Plan Note (Signed)
Check bmp on the BP med

## 2017-03-27 NOTE — Telephone Encounter (Signed)
I looked at patient's last Cr; she was in the hospital, kidney function was a little off; please ask her to have BMP done sometime in the next week or two just to make all back to normal I refilled her med in the meantime Thank you

## 2017-03-28 NOTE — Telephone Encounter (Signed)
Pt husband states she was in class and she will be out around 3:30. I mention to pt have her call us once she gets out.

## 2017-04-20 ENCOUNTER — Other Ambulatory Visit: Payer: Self-pay | Admitting: Family Medicine

## 2017-04-21 ENCOUNTER — Other Ambulatory Visit: Payer: Self-pay

## 2017-04-21 DIAGNOSIS — Z5181 Encounter for therapeutic drug level monitoring: Secondary | ICD-10-CM

## 2017-04-21 LAB — BASIC METABOLIC PANEL
BUN: 17 mg/dL (ref 7–25)
CHLORIDE: 105 mmol/L (ref 98–110)
CO2: 28 mmol/L (ref 20–31)
Calcium: 9.3 mg/dL (ref 8.6–10.4)
Creat: 1.04 mg/dL (ref 0.50–1.05)
GLUCOSE: 80 mg/dL (ref 65–99)
POTASSIUM: 3.8 mmol/L (ref 3.5–5.3)
SODIUM: 142 mmol/L (ref 135–146)

## 2017-04-21 NOTE — Telephone Encounter (Signed)
Patient/husband notified.

## 2017-04-21 NOTE — Telephone Encounter (Signed)
Please remind patient of the labs we had hoped to get around May 25th; please ask her to return ASAP for that lab; thank you I'll provide limited Rx

## 2017-06-08 ENCOUNTER — Other Ambulatory Visit: Payer: Self-pay | Admitting: Family Medicine

## 2017-11-25 ENCOUNTER — Ambulatory Visit: Payer: Self-pay | Admitting: *Deleted

## 2017-11-25 ENCOUNTER — Ambulatory Visit: Payer: BC Managed Care – PPO | Admitting: Family Medicine

## 2017-11-25 ENCOUNTER — Encounter: Payer: Self-pay | Admitting: Family Medicine

## 2017-11-25 VITALS — BP 120/72 | HR 69 | Temp 97.9°F | Resp 16 | Wt 124.5 lb

## 2017-11-25 DIAGNOSIS — R634 Abnormal weight loss: Secondary | ICD-10-CM

## 2017-11-25 DIAGNOSIS — J0100 Acute maxillary sinusitis, unspecified: Secondary | ICD-10-CM

## 2017-11-25 DIAGNOSIS — Z1231 Encounter for screening mammogram for malignant neoplasm of breast: Secondary | ICD-10-CM | POA: Diagnosis not present

## 2017-11-25 DIAGNOSIS — R42 Dizziness and giddiness: Secondary | ICD-10-CM | POA: Diagnosis not present

## 2017-11-25 DIAGNOSIS — Z1211 Encounter for screening for malignant neoplasm of colon: Secondary | ICD-10-CM

## 2017-11-25 MED ORDER — AMOXICILLIN-POT CLAVULANATE 875-125 MG PO TABS: 1.0000 | ORAL_TABLET | Freq: Two times a day (BID) | ORAL | 0 refills | Status: AC

## 2017-11-25 MED ORDER — FLUTICASONE PROPIONATE 50 MCG/ACT NA SUSP: 2.0000 | Freq: Every day | NASAL | 11 refills | Status: DC

## 2017-11-25 MED ORDER — FLUCONAZOLE 150 MG PO TABS: 150.0000 mg | ORAL_TABLET | Freq: Once | ORAL | 0 refills | Status: AC

## 2017-11-25 NOTE — Progress Notes (Signed)
BP 120/72 (BP Location: Right Arm, Patient Position: Sitting, Cuff Size: Normal)   Pulse 69   Temp 97.9 F (36.6 C) (Oral)   Resp 16   Wt 124 lb 8 oz (56.5 kg)   SpO2 96%   BMI 19.50 kg/m    Subjective:    Patient ID: Teresa Dodson, female    DOB: Dec 05, 1960, 57 y.o.   MRN: [id number]  HPI: Teresa Dodson is a 57 y.o. female  Chief Complaint  Patient presents with  . Dizziness    pt states seeing sparks in front of eyes    HPI Patient is here for an acute visit She is here with her husband She has been feeling light-headed She has been seeing spots in front of her eyes,little silver spots; 2-3 times Going on for just one week; last week and today Last week, she was in school, toward the end of the day; sitting down; not tunnel vision, felt like equilibrium was off; no ringing in ears; no stroke symptoms; no headache at that time; episode lasted 5 minutes; had eaten; went away on its own Happened again today; preparing the room for her lesson; felt a little light-headed; sat at desk for a few minutes; then went away Felt a little off No recent URI No head injuries No ear symptoms She felt like nostrils clogged before Not frequently sinus infection Left nostril felt clogged Not really a good eater; no eating disorder; 3 meals a day Weight loss has been gradual No fam hx of thyroid disease Grandmother had diabetes No fam hx of ocular migraine We reviewed her last 3 weights She brings her food to school Stress might be a factor; technology Not really much of a headache  Depression screen [facility] 2/9 11/25/2017 03/03/2017 01/03/2016 06/22/2015  Decreased Interest 0 0 0 0  Down, Depressed, Hopeless 0 0 0 0  PHQ - 2 Score 0 0 0 0    Relevant past medical, surgical, family and social history reviewed Past Medical History:  Diagnosis Date  . Allergic rhinitis with postnasal drip   . Cystadenofibroma of left ovary   . History of nephrolithiasis   . HTN, goal below 140/90    . Kidney stone   . Left-sided Bell's palsy    Past Surgical History:  Procedure Laterality Date  . ABDOMINAL HYSTERECTOMY     vaginal; partial   Family History  Problem Relation Age of Onset  . Alzheimer's disease Mother   . Cancer Father        lung  . Diabetes Paternal Grandmother    Social History   Tobacco Use  . Smoking status: Never Smoker  . Smokeless tobacco: Never Used  Substance Use Topics  . Alcohol use: No    Alcohol/week: 0.0 oz  . Drug use: No   Interim medical history since last visit reviewed. Allergies and medications reviewed  Review of Systems  Constitutional: Negative for fever.  Neurological: Positive for dizziness. Negative for headaches.   Per HPI unless specifically indicated above     Objective:    BP 120/72 (BP Location: Right Arm, Patient Position: Sitting, Cuff Size: Normal)   Pulse 69   Temp 97.9 F (36.6 C) (Oral)   Resp 16   Wt 124 lb 8 oz (56.5 kg)   SpO2 96%   BMI 19.50 kg/m   Wt Readings from Last 3 Encounters:  11/25/17 124 lb 8 oz (56.5 kg)  03/03/17 126 lb 4.8 oz (57.3 kg)  09/21/16 133  lb (60.3 kg)    Physical Exam  Constitutional: She appears well-developed and well-nourished. No distress.  HENT:  Head: Normocephalic and atraumatic.  Right Ear: No middle ear effusion.  Left Ear: A middle ear effusion is present.  Nose: Mucosal edema and rhinorrhea present.  Mouth/Throat: Oropharynx is clear and moist and mucous membranes are normal.  Enlarged nasal turbinate on the LEFT, erythematous  Eyes: EOM are normal. No scleral icterus.  Neck: No thyromegaly present.  Cardiovascular: Normal rate, regular rhythm and normal heart sounds.  No murmur heard. Pulmonary/Chest: Effort normal and breath sounds normal. No respiratory distress. She has no wheezes.  Abdominal: Soft. Bowel sounds are normal. She exhibits no distension.  Musculoskeletal: Normal range of motion. She exhibits no edema.  Neurological: She is alert. She  displays no atrophy and no tremor. No cranial nerve deficit. She exhibits normal muscle tone. She displays a negative Romberg sign. Coordination and gait normal.  Skin: Skin is warm and dry. She is not diaphoretic. No pallor.  Psychiatric: She has a normal mood and affect. Her behavior is normal. Judgment and thought content normal.  Good eye contact with examiner      Assessment & Plan:   Problem List Items Addressed This Visit      Other   Encounter for screening for malignant neoplasm of colon    Discussed colonoscopy vs cologuard, she opts for cologuard      Relevant Orders   Cologuard    Other Visit Diagnoses    Acute non-recurrent maxillary sinusitis    -  Primary   Relevant Medications   amoxicillin-clavulanate (AUGMENTIN) 875-125 MG tablet   fluticasone (FLONASE) 50 MCG/ACT nasal spray   Encounter for screening mammogram for breast cancer       Relevant Orders   MM DIGITAL SCREENING BILATERAL   Weight loss       Relevant Orders   TSH (Completed)   Dizziness       Relevant Orders   CBC with Differential/Platelet (Completed)   COMPLETE METABOLIC PANEL WITH GFR (Completed)       Follow up plan: No Follow-up on file.  An after-visit summary was printed and given to the patient at [employer].  Please see the patient instructions which may contain other information and recommendations beyond what is mentioned above in the assessment and plan.  Meds ordered this encounter  Medications  . amoxicillin-clavulanate (AUGMENTIN) 875-125 MG tablet    Sig: Take 1 tablet by mouth 2 (two) times daily for 10 days.    Dispense:  20 tablet    Refill:  0  . fluticasone (FLONASE) 50 MCG/ACT nasal spray    Sig: Place 2 sprays into both nostrils daily.    Dispense:  16 g    Refill:  11  . fluconazole (DIFLUCAN) 150 MG tablet    Sig: Take 1 tablet (150 mg total) by mouth once for 1 dose.    Dispense:  1 tablet    Refill:  0    Orders Placed This Encounter  Procedures  . MM  DIGITAL SCREENING BILATERAL  . Cologuard  . CBC with Differential/Platelet  . COMPLETE METABOLIC PANEL WITH GFR  . TSH

## 2017-11-25 NOTE — Telephone Encounter (Signed)
Patient is on Lotrel 5-10 mg tab qday.  Answer Assessment - Initial Assessment Questions Patient has been experiencing some lightheadedness on and off recently. She is on    1. DESCRIPTION: "Describe your dizziness."     Swimmy headed 2. LIGHTHEADED: "Do you feel lightheaded?" (e.g., somewhat faint, woozy, weak upon standing)   Just equalibrium  off slightly 3. VERTIGO: "Do you feel like either you or the room is spinning or tilting?" (i.e. vertigo)   no 4. SEVERITY: "How bad is it?"  "Do you feel like you are going to faint?" "Can you stand and walk?"   - MILD - walking normally   - MODERATE - interferes with normal activities (e.g., work, school)    - SEVERE - unable to stand, requires support to walk, feels like passing out now.     mild 5. ONSET:  "When did the dizziness begin?"     This morning. 6. AGGRAVATING FACTORS: "Does anything make it worse?" (e.g., standing, change in head position)    No. 7. HEART RATE: "Can you tell me your heart rate?" "How many beats in 15 seconds?"  (Note: not all patients can do this)       Can't find pulse right now. 8. CAUSE: "What do you think is causing the dizziness?"     Possibly b/p high. 9. RECURRENT SYMPTOM: "Have you had dizziness before?" If so, ask: "When was the last time?" "What happened that time?"     Yes but usually resolves. 10. OTHER SYMPTOMS: "Do you have any other symptoms?" (e.g., fever, chest pain, vomiting, diarrhea, bleeding)      Saw black spots earlier this week. 11. PREGNANCY: "Is there any chance you are pregnant?" "When was your last menstrual period?"   no  Protocols used: DIZZINESS [facility]

## 2017-11-25 NOTE — Patient Instructions (Addendum)
Please call us to update the medicine list Do not take both amlodipine We'll get labs today Start the nasal spray and antibiotics Please do eat yogurt or kimchi or take a probiotic daily for the next month We want to replace the healthy germs in the gut If you notice foul, watery diarrhea in the next two months, schedule an appointment RIGHT AWAY or go to an urgent care or the emergency room if a holiday or over a weekend Let me know if symptoms persist and we'll go from there

## 2017-11-25 NOTE — Assessment & Plan Note (Signed)
Discussed colonoscopy vs cologuard, she opts for cologuard

## 2017-11-26 LAB — CBC WITH DIFFERENTIAL/PLATELET
BASOS PCT: 0.5 %
Basophils Absolute: 30 cells/uL (ref 0–200)
EOS ABS: 60 {cells}/uL (ref 15–500)
EOS PCT: 1 %
HCT: 41.2 % (ref 35.0–45.0)
HEMOGLOBIN: 14.1 g/dL (ref 11.7–15.5)
Lymphs Abs: 1602 cells/uL (ref 850–3900)
MCH: 30.1 pg (ref 27.0–33.0)
MCHC: 34.2 g/dL (ref 32.0–36.0)
MCV: 87.8 fL (ref 80.0–100.0)
MONOS PCT: 7 %
MPV: 9.7 fL (ref 7.5–12.5)
NEUTROS ABS: 3888 {cells}/uL (ref 1500–7800)
Neutrophils Relative %: 64.8 %
Platelets: 222 10*3/uL (ref 140–400)
RBC: 4.69 10*6/uL (ref 3.80–5.10)
RDW: 12.5 % (ref 11.0–15.0)
Total Lymphocyte: 26.7 %
WBC mixed population: 420 cells/uL (ref 200–950)
WBC: 6 10*3/uL (ref 3.8–10.8)

## 2017-11-26 LAB — COMPLETE METABOLIC PANEL WITH GFR
AG RATIO: 1.9 (calc) (ref 1.0–2.5)
ALKALINE PHOSPHATASE (APISO): 44 U/L (ref 33–130)
ALT: 26 U/L (ref 6–29)
AST: 29 U/L (ref 10–35)
Albumin: 4.7 g/dL (ref 3.6–5.1)
BILIRUBIN TOTAL: 0.4 mg/dL (ref 0.2–1.2)
BUN/Creatinine Ratio: 14 (calc) (ref 6–22)
BUN: 16 mg/dL (ref 7–25)
CALCIUM: 9.8 mg/dL (ref 8.6–10.4)
CHLORIDE: 106 mmol/L (ref 98–110)
CO2: 30 mmol/L (ref 20–32)
Creat: 1.16 mg/dL — ABNORMAL HIGH (ref 0.50–1.05)
GFR, Est African American: 61 mL/min/{1.73_m2} (ref >=60)
GFR, Est Non African American: 52 mL/min/{1.73_m2} — ABNORMAL LOW (ref >=60)
GLOBULIN: 2.5 g/dL (ref 1.9–3.7)
Glucose, Bld: 83 mg/dL (ref 65–99)
POTASSIUM: 4 mmol/L (ref 3.5–5.3)
Sodium: 144 mmol/L (ref 135–146)
Total Protein: 7.2 g/dL (ref 6.1–8.1)

## 2017-11-26 LAB — TSH: TSH: 1.51 m[IU]/L (ref 0.40–4.50)

## 2017-11-27 ENCOUNTER — Other Ambulatory Visit: Payer: Self-pay

## 2017-11-27 ENCOUNTER — Telehealth: Payer: Self-pay | Admitting: Family Medicine

## 2017-11-27 DIAGNOSIS — N289 Disorder of kidney and ureter, unspecified: Secondary | ICD-10-CM

## 2017-11-27 NOTE — Telephone Encounter (Signed)
Pt given results and recommendations per notes of Dr. Sanda Klein on 11/26/17. Pt verbalized understanding.Pt also states that she feels a lot better. Unable to document in result note due to note not being routed to [facility]

## 2017-12-18 LAB — COLOGUARD: COLOGUARD: NEGATIVE

## 2017-12-20 ENCOUNTER — Other Ambulatory Visit: Payer: Self-pay | Admitting: Family Medicine

## 2018-01-18 ENCOUNTER — Other Ambulatory Visit: Payer: Self-pay | Admitting: Family Medicine

## 2018-03-22 ENCOUNTER — Other Ambulatory Visit: Payer: Self-pay | Admitting: Family Medicine

## 2018-03-22 DIAGNOSIS — N289 Disorder of kidney and ureter, unspecified: Secondary | ICD-10-CM

## 2018-03-23 NOTE — Telephone Encounter (Signed)
Left voicemial 

## 2018-03-23 NOTE — Telephone Encounter (Signed)
Please remind patient that she was due for labs in April I'll refill med Thank you

## 2018-04-18 ENCOUNTER — Other Ambulatory Visit: Payer: Self-pay | Admitting: Family Medicine

## 2018-04-20 NOTE — Telephone Encounter (Signed)
Patient is overdue for labs; see note from lab results in January; again, in May Please ask patient to come by for blood and urine tests in the next week; I'll approve 7 days of meds

## 2018-04-20 NOTE — Telephone Encounter (Signed)
Unable to leave voicemail due to being full 

## 2018-05-13 LAB — MICROALBUMIN / CREATININE URINE RATIO
Creatinine, Urine: 154 mg/dL (ref 20–275)
Microalb Creat Ratio: 7 mcg/mg creat (ref <30)
Microalb, Ur: 1.1 mg/dL

## 2018-05-13 LAB — BASIC METABOLIC PANEL WITH GFR
BUN: 16 mg/dL (ref 7–25)
CHLORIDE: 107 mmol/L (ref 98–110)
CO2: 31 mmol/L (ref 20–32)
CREATININE: 0.96 mg/dL (ref 0.50–1.05)
Calcium: 9.3 mg/dL (ref 8.6–10.4)
GFR, Est African American: 76 mL/min/{1.73_m2} (ref >=60)
GFR, Est Non African American: 66 mL/min/{1.73_m2} (ref >=60)
GLUCOSE: 81 mg/dL (ref 65–139)
Potassium: 4 mmol/L (ref 3.5–5.3)
SODIUM: 145 mmol/L (ref 135–146)

## 2018-05-23 ENCOUNTER — Other Ambulatory Visit: Payer: Self-pay | Admitting: Family Medicine

## 2018-05-23 NOTE — Telephone Encounter (Signed)
Last labs reviewed; 6 month supply approved

## 2018-08-09 ENCOUNTER — Other Ambulatory Visit: Payer: Self-pay | Admitting: Family Medicine

## 2019-01-19 ENCOUNTER — Telehealth: Payer: Self-pay | Admitting: Family Medicine

## 2019-01-19 NOTE — Telephone Encounter (Signed)
Patient has not been seen since January of 2019  I reviewed last Cr and K+  Lab Results  Component Value Date   CREATININE 0.96 05/12/2018   Lab Results  Component Value Date   K 4.0 05/12/2018   Rx approved for 30 days and we'll ask her to make an appointment since it's been so long since her last visit

## 2019-01-19 NOTE — Telephone Encounter (Signed)
appt made for 4.1.2020 with you

## 2019-02-03 ENCOUNTER — Ambulatory Visit: Payer: BC Managed Care – PPO | Admitting: Family Medicine

## 2019-02-15 ENCOUNTER — Telehealth: Payer: Self-pay | Admitting: Family Medicine

## 2019-02-15 NOTE — Telephone Encounter (Signed)
Pt had appt scheduled for 4.23.2020 and I had her to move it up for tomorrow. Informed her that it will be a virtual visit. Pt verbalized understanding. Pt aware that a limited supply of her prescription has been sent to her pharmacy

## 2019-02-15 NOTE — Telephone Encounter (Signed)
Patient has not been seen since January 2019 See March note Appointment needed I approved 7 days only

## 2019-02-16 ENCOUNTER — Ambulatory Visit (INDEPENDENT_AMBULATORY_CARE_PROVIDER_SITE_OTHER): Payer: BC Managed Care – PPO | Admitting: Family Medicine

## 2019-02-16 ENCOUNTER — Encounter: Payer: Self-pay | Admitting: Family Medicine

## 2019-02-16 ENCOUNTER — Other Ambulatory Visit: Payer: Self-pay

## 2019-02-16 VITALS — Wt 132.0 lb

## 2019-02-16 DIAGNOSIS — I10 Essential (primary) hypertension: Secondary | ICD-10-CM

## 2019-02-16 DIAGNOSIS — Z0000 Encounter for general adult medical examination without abnormal findings: Secondary | ICD-10-CM | POA: Diagnosis not present

## 2019-02-16 DIAGNOSIS — Z1231 Encounter for screening mammogram for malignant neoplasm of breast: Secondary | ICD-10-CM

## 2019-02-16 DIAGNOSIS — J309 Allergic rhinitis, unspecified: Secondary | ICD-10-CM | POA: Diagnosis not present

## 2019-02-16 DIAGNOSIS — R0982 Postnasal drip: Secondary | ICD-10-CM

## 2019-02-16 MED ORDER — AMLODIPINE BESY-BENAZEPRIL HCL 5-10 MG PO CAPS: 1.0000 | ORAL_CAPSULE | Freq: Every day | ORAL | 3 refills | Status: DC

## 2019-02-16 NOTE — Progress Notes (Signed)
Wt 132 lb (59.9 kg)   BMI 20.67 kg/m    Subjective:    Patient ID: Teresa Dodson, female    DOB: 01-28-1961, 58 y.o.   MRN: [id number]  HPI: Teresa Dodson is a 58 y.o. female  Chief Complaint  Patient presents with  . Medication Refill    HPI Virtual Visit via Telephone/Video Note   I connected with the patient via:  Doximity video I verified that I am speaking with the correct person using two identifiers.  Call started: 1:33 pm Call terminated: 1:43 pm Total length of call: 10 min   I discussed the limitations, risks, and privacy concerns of performing an evaluation and management service by telephone and the availability of in-person appointments. I explained that he/she may be responsible for charges related to this service. The patient expressed understanding and agreed to proceed.  Provider location: home, upstairs office with door closed, earphones/headset on Patient location: home Additional participants: none  She is hanging in there; getting a little bit of break Online teaching, etc. No known exposures to COVID-19; no sx; taking all the precautions, social isolating  Patient has hypertension She has a cuff and can check  She will call us back with readings  No chest pain, palpitations, no leg edema Taking medicines regular She does eat some salt; not much  Lab Results  Component Value Date   CREATININE 0.96 05/12/2018   Lab Results  Component Value Date   K 4.0 05/12/2018   Allergic rhinitis She is using fluticasone only when needed; not regularly Doing okay in terms of her allergies right now  When pandemic slows, she'll get mammogram No bumps in the breast   Depression screen [facility] 2/9 02/16/2019 11/25/2017 03/03/2017 01/03/2016 06/22/2015  Decreased Interest 0 0 0 0 0  Down, Depressed, Hopeless 0 0 0 0 0  PHQ - 2 Score 0 0 0 0 0  Altered sleeping 0 - - - -  Tired, decreased energy 0 - - - -  Change in appetite 0 - - - -  Feeling bad or  failure about yourself  0 - - - -  Trouble concentrating 0 - - - -  Moving slowly or fidgety/restless 0 - - - -  Suicidal thoughts 0 - - - -  PHQ-9 Score 0 - - - -  Difficult doing work/chores Not difficult at all - - - -   Fall Risk  02/16/2019 11/25/2017 03/03/2017 01/03/2016 06/22/2015  Falls in the past year? 0 No No No No  Number falls in past yr: 0 - - - -  Injury with Fall? 0 - - - -    Relevant past medical, surgical, family and social history reviewed Past Medical History:  Diagnosis Date  . Allergic rhinitis with postnasal drip   . Cystadenofibroma of left ovary   . History of nephrolithiasis   . HTN, goal below 140/90   . Kidney stone   . Left-sided Bell's palsy    Past Surgical History:  Procedure Laterality Date  . ABDOMINAL HYSTERECTOMY     vaginal; partial   Family History  Problem Relation Age of Onset  . Alzheimer's disease Mother   . Cancer Father        lung  . Diabetes Paternal Grandmother    Social History   Tobacco Use  . Smoking status: Never Smoker  . Smokeless tobacco: Never Used  Substance Use Topics  . Alcohol use: No    Alcohol/week: 0.0 standard drinks  .  Drug use: No     [facility] Visit from 02/16/2019 in [facility]  AUDIT-C Score  0      Interim medical history since last visit reviewed. Allergies and medications reviewed  Review of Systems Per HPI unless specifically indicated above     Objective:    Wt 132 lb (59.9 kg)   BMI 20.67 kg/m   Wt Readings from Last 3 Encounters:  02/16/19 132 lb (59.9 kg)  11/25/17 124 lb 8 oz (56.5 kg)  03/03/17 126 lb 4.8 oz (57.3 kg)    Physical Exam Constitutional:      Appearance: Normal appearance.  Eyes:     Extraocular Movements: Extraocular movements intact.     Right eye: Normal extraocular motion.     Left eye: Normal extraocular motion.  Pulmonary:     Effort: No tachypnea or respiratory distress.  Skin:    Coloration: Skin is not jaundiced or pale.   Neurological:     Mental Status: She is alert.     Cranial Nerves: No dysarthria.  Psychiatric:        Mood and Affect: Mood is not anxious or depressed.        Speech: Speech is not rapid and pressured, delayed or slurred.     Results for orders placed or performed in visit on 65/03/54  BASIC METABOLIC PANEL WITH GFR  Result Value Ref Range   Glucose, Bld 81 65 - 139 mg/dL   BUN 16 7 - 25 mg/dL   Creat 0.96 0.50 - 1.05 mg/dL   GFR, Est Non African American 66 > OR = 60 mL/min/1.29m2   GFR, Est African American 76 > OR = 60 mL/min/1.66m2   BUN/Creatinine Ratio NOT APPLICABLE 6 - 22 (calc)   Sodium 145 135 - 146 mmol/L   Potassium 4.0 3.5 - 5.3 mmol/L   Chloride 107 98 - 110 mmol/L   CO2 31 20 - 32 mmol/L   Calcium 9.3 8.6 - 10.4 mg/dL  Urine Microalbumin w/creat. ratio  Result Value Ref Range   Creatinine, Urine 154 20 - 275 mg/dL   Microalb, Ur 1.1 mg/dL   Microalb Creat Ratio 7 <30 mcg/mg creat      Assessment & Plan:   Problem List Items Addressed This Visit      Cardiovascular and Mediastinum   Hypertension, goal below 140/90 - Primary    She will check her BP and notify us of reading later today; continue CCB-ARB combo; last Cr and K+ reviewed; encouraged less sodium; watch amounts in canned foods; refills of medicine provided      Relevant Medications   amLODipine-benazepril (LOTREL) 5-10 MG capsule     Respiratory   Allergic rhinitis with postnasal drip    Discussed allergies in the context of COVID-19 pandemic; avoid touching face; glad her allergies have not been bad this spring; fluticason if needed       Other Visit Diagnoses    Visit for screening mammogram       patient denies breast lumps; mamogram ordered today and she will go when pandemic is over   Relevant Orders   MM 3D SCREEN BREAST BILATERAL   Preventative health care       we are not doing CPEs right now because of the pandemic; however, she can have her labs done in the next few months at  her convenience, CPE in the fall   Relevant Orders   CBC   COMPLETE METABOLIC PANEL WITH GFR  Lipid panel   TSH       Follow up plan: Return in about 1 year (around 02/16/2020) for follow-up visit with Dr. Sanda Klein; HTN; CPE this fall.  An after-visit summary was printed and given to the patient at [facility].  Please see the patient instructions which may contain other information and recommendations beyond what is mentioned above in the assessment and plan.  Meds ordered this encounter  Medications  . amLODipine-benazepril (LOTREL) 5-10 MG capsule    Sig: Take 1 capsule by mouth daily.    Dispense:  90 capsule    Refill:  3    Orders Placed This Encounter  Procedures  . MM 3D SCREEN BREAST BILATERAL  . CBC  . COMPLETE METABOLIC PANEL WITH GFR  . Lipid panel  . TSH

## 2019-02-16 NOTE — Assessment & Plan Note (Addendum)
She will check her BP and notify us of reading later today; continue CCB-ARB combo; last Cr and K+ reviewed; encouraged less sodium; watch amounts in canned foods; refills of medicine provided

## 2019-02-16 NOTE — Assessment & Plan Note (Signed)
Discussed allergies in the context of COVID-19 pandemic; avoid touching face; glad her allergies have not been bad this spring; fluticason if needed

## 2019-02-24 ENCOUNTER — Ambulatory Visit (INDEPENDENT_AMBULATORY_CARE_PROVIDER_SITE_OTHER): Payer: BC Managed Care – PPO | Admitting: Nurse Practitioner

## 2019-02-24 ENCOUNTER — Encounter: Payer: Self-pay | Admitting: Nurse Practitioner

## 2019-02-24 ENCOUNTER — Other Ambulatory Visit: Payer: Self-pay

## 2019-02-24 VITALS — Ht 68.0 in | Wt 131.6 lb

## 2019-02-24 DIAGNOSIS — L02215 Cutaneous abscess of perineum: Secondary | ICD-10-CM

## 2019-02-24 MED ORDER — CEPHALEXIN 500 MG PO CAPS: 500.0000 mg | ORAL_CAPSULE | Freq: Three times a day (TID) | ORAL | 0 refills | Status: DC

## 2019-02-24 NOTE — Progress Notes (Signed)
Virtual Visit via Video Note  I connected with Teresa Dodson on 02/24/19 at 11:20 AM EDT by a video enabled telemedicine application and verified that I am speaking with the correct person using two identifiers.   Staff discussed the limitations of evaluation and management by telemedicine and the availability of in person appointments. The patient expressed understanding and agreed to proceed.  Patient location: home  My location: home  office Other people present:  none HPI  Patient notes swollen, tender spot next to vaginal area. First noticed it 2 days ago, it is very tender to touch. She has tried using heat, hydrogen peroxide soaks to the area and  ointment without relief. States area was smaller but is increasing in size, states able to expectorate   PHQ2/9: Depression screen [facility] 2/9 02/24/2019 02/16/2019 11/25/2017 03/03/2017 01/03/2016  Decreased Interest 0 0 0 0 0  Down, Depressed, Hopeless 2 0 0 0 0  PHQ - 2 Score 2 0 0 0 0  Altered sleeping 0 0 - - -  Tired, decreased energy 0 0 - - -  Change in appetite 0 0 - - -  Feeling bad or failure about yourself  0 0 - - -  Trouble concentrating 0 0 - - -  Moving slowly or fidgety/restless 0 0 - - -  Suicidal thoughts 0 0 - - -  PHQ-9 Score 2 0 - - -  Difficult doing work/chores Not difficult at all Not difficult at all - - -     PHQ reviewed. Negative  Patient Active Problem List   Diagnosis Date Noted  . Medication monitoring encounter 03/27/2017  . Annual physical exam 01/03/2016  . Encounter for screening mammogram for malignant neoplasm of breast 01/03/2016  . History of bilateral saline breast implants 01/03/2016  . Encounter for screening for malignant neoplasm of colon 01/03/2016  . Encounter for screening for malignant neoplasm of cervix 01/03/2016  . 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 History of nephrolithiasis 06/22/2015  . Hypertension, goal below 140/90 06/22/2015  . Allergic rhinitis with postnasal drip  06/22/2015  . Foot pain, bilateral 06/22/2015  . History of Bell's palsy 09/14/2014  . 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 Allergic rhinitis with postnasal drip   . Cystadenofibroma of left ovary   . History of nephrolithiasis   . HTN, goal below 140/90   . Kidney stone   . Left-sided Bell's palsy     Past Surgical History:  Procedure Laterality Date  . ABDOMINAL HYSTERECTOMY     vaginal; partial    Social History   Tobacco Use  . Smoking status: Never Smoker  . Smokeless tobacco: Never Used  Substance Use Topics  . Alcohol use: No    Alcohol/week: 0.0 standard drinks     Current Outpatient Medications:  .  amLODipine-benazepril (LOTREL) 5-10 MG capsule, Take 1 capsule by mouth daily., Disp: 90 capsule, Rfl: 3 .  cholecalciferol (VITAMIN D) 1000 units tablet, Take 1,000 Units by mouth daily., Disp: , Rfl:  .  Multiple Vitamins-Minerals (HAIR SKIN AND NAILS FORMULA PO), Take by mouth., Disp: , Rfl:  .  Multiple Vitamins-Minerals (MULTIPLE VITAMINS/WOMENS PO), Take by mouth., Disp: , Rfl:  .  fluticasone (FLONASE) 50 MCG/ACT nasal spray, Place 2 sprays into both nostrils daily. (Patient not taking: Reported on 02/16/2019), Disp: 16 g, Rfl: 11  Allergies  Allergen Reactions  . Sulfamethoxazole-Trimethoprim Rash    ROS    No other specific  complaints in a complete review of systems (except as listed in HPI above).  Objective  Vitals:   02/24/19 1051  Weight: 131 lb 9.6 oz (59.7 kg)  Height: 5\' 8"  (1.727 m)     Body mass index is 20.01 kg/m.  Nursing Note and Vital Signs reviewed.  Physical Exam Genitourinary:       Constitutional: Patient appears well-developed and well-nourished. No distress.  HENT: Head: Normocephalic and atraumatic. Pulmonary/Chest: Effort normal  Musculoskeletal: Normal range of motion,  Neurological: he is alert and oriented to person, place, and time. speech and gait are  normal.   Psychiatric: Patient has a normal mood and affect. behavior is normal. Judgment and thought content normal.    Assessment & Plan  1. Abscess of perineum Discussed warm moist heat; if unimproved in 4 days will call to let us know.  - cephALEXin (KEFLEX) 500 MG capsule; Take 1 capsule (500 mg total) by mouth 3 (three) times daily.  Dispense: 30 capsule; Refill: 0    Follow Up Instructions:  Follow up as needed  I discussed the assessment and treatment plan with the patient. The patient was provided an opportunity to ask questions and all were answered. The patient agreed with the plan and demonstrated an understanding of the instructions.   The patient was advised to call back or seek an in-person evaluation if the symptoms worsen or if the condition fails to improve as anticipated.  I provided 11 minutes of non-face-to-face time during this encounter.   Fredderick Severance, NP

## 2019-02-25 ENCOUNTER — Ambulatory Visit: Payer: BC Managed Care – PPO | Admitting: Family Medicine

## 2019-03-31 ENCOUNTER — Ambulatory Visit: Payer: BC Managed Care – PPO | Admitting: Family Medicine

## 2019-03-31 ENCOUNTER — Encounter: Payer: Self-pay | Admitting: Family Medicine

## 2019-03-31 ENCOUNTER — Telehealth: Payer: Self-pay | Admitting: Family Medicine

## 2019-03-31 ENCOUNTER — Other Ambulatory Visit: Payer: Self-pay

## 2019-03-31 ENCOUNTER — Other Ambulatory Visit: Payer: Self-pay | Admitting: Family Medicine

## 2019-03-31 VITALS — BP 116/70 | HR 73 | Temp 98.0°F | Resp 12 | Ht 68.0 in | Wt 129.3 lb

## 2019-03-31 DIAGNOSIS — M7551 Bursitis of right shoulder: Secondary | ICD-10-CM | POA: Diagnosis not present

## 2019-03-31 DIAGNOSIS — I10 Essential (primary) hypertension: Secondary | ICD-10-CM | POA: Diagnosis not present

## 2019-03-31 MED ORDER — LIDOCAINE HCL 1 % IJ SOLN: 10.0000 mL | Freq: Once | INTRAMUSCULAR | Status: DC

## 2019-03-31 MED ORDER — MELOXICAM 15 MG PO TABS: 15.0000 mg | ORAL_TABLET | Freq: Every day | ORAL | 0 refills | Status: DC

## 2019-03-31 MED ORDER — TRIAMCINOLONE ACETONIDE 40 MG/ML IJ SUSP
40.0000 mg | Freq: Once | INTRAMUSCULAR | Status: AC
  Administered 2019-03-31: 40 mg via INTRAMUSCULAR

## 2019-03-31 MED ORDER — TRAMADOL HCL 50 MG PO TABS: 50.0000 mg | ORAL_TABLET | Freq: Three times a day (TID) | ORAL | 0 refills | Status: AC | PRN

## 2019-03-31 MED ORDER — LIDOCAINE HCL (PF) 1 % IJ SOLN
2.0000 mL | Freq: Once | INTRAMUSCULAR | Status: AC
  Administered 2019-03-31: 2 mL via INTRADERMAL

## 2019-03-31 NOTE — Telephone Encounter (Signed)
Copied from [place] [id number]. Topic: Quick Communication - Rx Refill/Question >> Mar 31, 2019  1:10 PM Gustavus Messing wrote: Medication: traMADol (ULTRAM) 50 MG tablet [[id number]]    Has the patient contacted their pharmacy? No. (Agent: If no, request that the patient contact the pharmacy for the refill.) The patient is still in the pain that she was in when this medication was prescribed. She needs it again.     Preferred Pharmacy (with phone number or street name): [id number] - [address], [place] [phone] (Phone) [phone] (Fax)    Agent: Please be advised that RX refills may take up to 3 business days. We ask that you follow-up with your pharmacy.

## 2019-03-31 NOTE — Patient Instructions (Signed)
Shoulder Impingement Syndrome Rehab  Ask your health care provider which exercises are safe for you. Do exercises exactly as told by your health care provider and adjust them as directed. It is normal to feel mild stretching, pulling, tightness, or discomfort as you do these exercises, but you should stop right away if you feel sudden pain or your pain gets worse. Do not begin these exercises until told by your health care provider.  Stretching and range of motion exercise  This exercise warms up your muscles and joints and improves the movement and flexibility of your shoulder. This exercise also helps to relieve pain and stiffness.  Exercise A: Passive horizontal adduction    1. Sit or stand and pull your left / right elbow across your chest, toward your other shoulder. Stop when you feel a gentle stretch in the back of your shoulder and upper arm.  ? Keep your arm at shoulder height.  ? Keep your arm as close to your body as you comfortably can.  2. Hold for __________ seconds.  3. Slowly return to the starting position.  Repeat __________ times. Complete this exercise __________ times a day.  Strengthening exercises  These exercises build strength and endurance in your shoulder. Endurance is the ability to use your muscles for a long time, even after they get tired.  Exercise B: External rotation, isometric  1. Stand or sit in a doorway, facing the door frame.  2. Bend your left / right elbow and place the back of your wrist against the door frame. Only your wrist should be touching the frame. Keep your upper arm at your side.  3. Gently press your wrist against the door frame, as if you are trying to push your arm away from your abdomen.  ? Avoid shrugging your shoulder while you press your hand against the door frame. Keep your shoulder blade tucked down toward the middle of your back.  4. Hold for __________ seconds.  5. Slowly release the tension, and relax your muscles completely before you do the exercise  again.  Repeat __________ times. Complete this exercise __________ times a day.  Exercise C: Internal rotation, isometric    1. Stand or sit in a doorway, facing the door frame.  2. Bend your left / right elbow and place the inside of your wrist against the door frame. Only your wrist should be touching the frame. Keep your upper arm at your side.  3. Gently press your wrist against the door frame, as if you are trying to push your arm toward your abdomen.  ? Avoid shrugging your shoulder while you press your hand against the door frame. Keep your shoulder blade tucked down toward the middle of your back.  4. Hold for __________ seconds.  5. Slowly release the tension, and relax your muscles completely before you do the exercise again.  Repeat __________ times. Complete this exercise __________ times a day.  Exercise D: Scapular protraction, supine    1. Lie on your back on a firm surface. Hold a __________ weight in your left / right hand.  2. Raise your left / right arm straight into the air so your hand is directly above your shoulder joint.  3. Push the weight into the air so your shoulder lifts off of the surface that you are lying on. Do not move your head, neck, or back.  4. Hold for __________ seconds.  5. Slowly return to the starting position. Let your muscles relax completely before   you repeat this exercise.  Repeat __________ times. Complete this exercise __________ times a day.  Exercise E: Scapular retraction    1. Sit in a stable chair without armrests, or stand.  2. Secure an exercise band to a stable object in front of you so the band is at shoulder height.  3. Hold one end of the exercise band in each hand. Your palms should face down.  4. Squeeze your shoulder blades together and move your elbows slightly behind you. Do not shrug your shoulders while you do this.  5. Hold for __________ seconds.  6. Slowly return to the starting position.  Repeat __________ times. Complete this exercise __________  times a day.  Exercise F: Shoulder extension    1. Sit in a stable chair without armrests, or stand.  2. Secure an exercise band to a stable object in front of you where the band is above shoulder height.  3. Hold one end of the exercise band in each hand.  4. Straighten your elbows and lift your hands up to shoulder height.  5. Squeeze your shoulder blades together and pull your hands down to the sides of your thighs. Stop when your hands are straight down by your sides. Do not let your hands go behind your body.  6. Hold for __________ seconds.  7. Slowly return to the starting position.  Repeat __________ times. Complete this exercise __________ times a day.  This information is not intended to replace advice given to you by your health care provider. Make sure you discuss any questions you have with your health care provider.  Document Released: 10/21/2005 Document Revised: 06/27/2016 Document Reviewed: 09/23/2015  [employer] Interactive Patient Education © 2019 [employer]

## 2019-03-31 NOTE — Progress Notes (Signed)
Name: Teresa Dodson   MRN: [id number]    DOB: 08-13-1961   Date:03/31/2019       Progress Note  Subjective  Chief Complaint  Chief Complaint  Patient presents with  . Arm Pain    rt top of arm onset 1 month went to urgent care 2 weeks ago given predisone and tramadol helped for little while now back.    HPI  Bursitis: she woke up last week with right shoulder pain and inability to raise her right shoulder, she went to [facility] and was given 6 days steroid taper and tramadol for pain, she states symptoms resolved, however finished the prednisone course and woke up this am with same symptoms. She denies change in activity or trauma. Never had this before  HTN: explained that nsaid's will raise her bp and needs to follow a DASH diet, she will also monitor bp at home, no chest pain or palpitation   Patient Active Problem List   Diagnosis Date Noted  . Medication monitoring encounter 03/27/2017  . Annual physical exam 01/03/2016  . Encounter for screening mammogram for malignant neoplasm of breast 01/03/2016  . History of bilateral saline breast implants 01/03/2016  . Encounter for screening for malignant neoplasm of colon 01/03/2016  . Encounter for screening for malignant neoplasm of cervix 01/03/2016  . 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 History of nephrolithiasis 06/22/2015  . Hypertension, goal below 140/90 06/22/2015  . Allergic rhinitis with postnasal drip 06/22/2015  . Foot pain, bilateral 06/22/2015  . History of Bell's palsy 09/14/2014  . 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 ABDOMINAL HYSTERECTOMY     vaginal; partial    Family History  Problem Relation Age of Onset  . Alzheimer's disease Mother   . Cancer Father        lung  . Diabetes Paternal Grandmother     Social History   Socioeconomic History  . Marital status: Married    Spouse name: Audry Pili  . Number of children: 2  . Years of education: Not  on file  . Highest education level: Bachelor's degree (e.g., BA, AB, BS)  Occupational History  . Not on file  Social Needs  . Financial resource strain: Not hard at all  . Food insecurity:    Worry: Never true    Inability: Never true  . Transportation needs:    Medical: No    Non-medical: No  Tobacco Use  . Smoking status: Never Smoker  . Smokeless tobacco: Never Used  Substance and Sexual Activity  . Alcohol use: No    Alcohol/week: 0.0 standard drinks  . Drug use: No  . Sexual activity: Yes    Partners: Male  Lifestyle  . Physical activity:    Days per week: 7 days    Minutes per session: 30 min  . Stress: Not at all  Relationships  . Social connections:    Talks on phone: More than three times a week    Gets together: Once a week    Attends religious service: More than 4 times per year    Active member of club or organization: Yes    Attends meetings of clubs or organizations: More than 4 times per year    Relationship status: Married  . Intimate partner violence:    Fear of current or ex partner: No    Emotionally abused: No    Physically abused: No    Forced sexual activity: No  Other Topics Concern  . Not on file  Social History Narrative  . Not on file     Current Outpatient Medications:  .  amLODipine-benazepril (LOTREL) 5-10 MG capsule, Take 1 capsule by mouth daily., Disp: 90 capsule, Rfl: 3 .  cholecalciferol (VITAMIN D) 1000 units tablet, Take 1,000 Units by mouth daily., Disp: , Rfl:  .  fluticasone (FLONASE) 50 MCG/ACT nasal spray, Place 2 sprays into both nostrils daily., Disp: 16 g, Rfl: 11 .  Multiple Vitamins-Minerals (HAIR SKIN AND NAILS FORMULA PO), Take by mouth., Disp: , Rfl:  .  Multiple Vitamins-Minerals (MULTIPLE VITAMINS/WOMENS PO), Take by mouth., Disp: , Rfl:  .  cephALEXin (KEFLEX) 500 MG capsule, Take 1 capsule (500 mg total) by mouth 3 (three) times daily. (Patient not taking: Reported on 03/31/2019), Disp: 30 capsule, Rfl:  0  Allergies  Allergen Reactions  . Sulfamethoxazole-Trimethoprim Rash    I personally reviewed active problem list, medication list, allergies, family history, social history with the patient/caregiver today.   ROS  Ten systems reviewed and is negative except as mentioned in HPI  Objective  Vitals:   03/31/19 1152  BP: 116/70  Pulse: 73  Resp: 12  Temp: 98 F (36.7 C)  TempSrc: Oral  SpO2: 99%  Weight: 129 lb 4.8 oz (58.7 kg)  Height: 5\' 8"  (1.727 m)    Body mass index is 19.66 kg/m.  Physical Exam  Constitutional: Patient appears well-developed and well-nourished. No distress.  HEENT: head atraumatic, normocephalic, pupils equal and reactive to light,  neck supple, throat within normal limits Cardiovascular: Normal rate, regular rhythm and normal heart sounds.  No murmur heard. No BLE edema. Pulmonary/Chest: Effort normal and breath sounds normal. No respiratory distress. Abdominal: Soft.  There is no tenderness. Psychiatric: Patient has a normal mood and affect. behavior is normal. Judgment and thought content normal. Muscular Skeletal: pain with abduction of arm above 90 degrees , internal rotation and positive empty can sign   PHQ2/9: Depression screen [facility] 2/9 03/31/2019 02/24/2019 02/16/2019 11/25/2017 03/03/2017  Decreased Interest 0 0 0 0 0  Down, Depressed, Hopeless 0 2 0 0 0  PHQ - 2 Score 0 2 0 0 0  Altered sleeping 0 0 0 - -  Tired, decreased energy 0 0 0 - -  Change in appetite 0 0 0 - -  Feeling bad or failure about yourself  0 0 0 - -  Trouble concentrating 0 0 0 - -  Moving slowly or fidgety/restless 0 0 0 - -  Suicidal thoughts 0 0 0 - -  PHQ-9 Score 0 2 0 - -  Difficult doing work/chores Not difficult at all Not difficult at all Not difficult at all - -    phq 9 is negative  Fall Risk: Fall Risk  03/31/2019 02/24/2019 02/16/2019 11/25/2017 03/03/2017  Falls in the past year? 0 0 0 No No  Number falls in past yr: 0 0 0 - -  Injury with Fall? 0 0  0 - -     Functional Status Survey: Is the patient deaf or have difficulty hearing?: No Does the patient have difficulty seeing, even when wearing glasses/contacts?: No Does the patient have difficulty concentrating, remembering, or making decisions?: No Does the patient have difficulty walking or climbing stairs?: No Does the patient have difficulty dressing or bathing?: No Does the patient have difficulty doing errands alone such as visiting a doctor's office or shopping?: No    Assessment & Plan  1. Hypertension, goal  below 140/90  Explained nsaids will raise bp, try to monitor at home and drink water   2. Bursitis of right deltoid  She will also take meloxicam 15 mg daily with food  Consent form signed Localized bursa - right deltoid  Area prepped with alcohol  Injection with lidocaine 1% and Kenalog 40mg /1 ml on deltoid bursa sack Patient tolerated procedure well, and pain decreased after injection No side effects

## 2019-04-21 ENCOUNTER — Encounter: Payer: Self-pay | Admitting: Nurse Practitioner

## 2019-04-21 ENCOUNTER — Ambulatory Visit: Payer: BC Managed Care – PPO | Admitting: Nurse Practitioner

## 2019-04-21 ENCOUNTER — Other Ambulatory Visit: Payer: Self-pay

## 2019-04-21 VITALS — BP 126/72 | HR 70 | Temp 98.4°F | Resp 14 | Ht 67.5 in | Wt 131.8 lb

## 2019-04-21 DIAGNOSIS — L739 Follicular disorder, unspecified: Secondary | ICD-10-CM | POA: Diagnosis not present

## 2019-04-21 MED ORDER — DOXYCYCLINE HYCLATE 100 MG PO TABS: 100.0000 mg | ORAL_TABLET | Freq: Two times a day (BID) | ORAL | 0 refills | Status: DC

## 2019-04-21 NOTE — Progress Notes (Signed)
Name: Teresa Dodson   MRN: [id number]    DOB: Feb 22, 1961   Date:04/21/2019       Progress Note  Subjective  Chief Complaint  Chief Complaint  Patient presents with  . Skin Problem    Onset yesteday, bump on vagina    HPI  Patient endorses bump between right labial lip and thigh. Was not painful, yesterday it popped. Patient endorses some creamy discharge.  Does not shave- was waxing but hasnt in awhile.  Denies fevers, chills.  PHQ2/9: Depression screen [facility] 2/9 04/21/2019 03/31/2019 02/24/2019 02/16/2019 11/25/2017  Decreased Interest 0 0 0 0 0  Down, Depressed, Hopeless 0 0 2 0 0  PHQ - 2 Score 0 0 2 0 0  Altered sleeping 0 0 0 0 -  Tired, decreased energy 0 0 0 0 -  Change in appetite 0 0 0 0 -  Feeling bad or failure about yourself  0 0 0 0 -  Trouble concentrating 0 0 0 0 -  Moving slowly or fidgety/restless 0 0 0 0 -  Suicidal thoughts 0 0 0 0 -  PHQ-9 Score 0 0 2 0 -  Difficult doing work/chores Not difficult at all Not difficult at all Not difficult at all Not difficult at all -    PHQ reviewed. Negative  Patient Active Problem List   Diagnosis Date Noted  . Medication monitoring encounter 03/27/2017  . Annual physical exam 01/03/2016  . Encounter for screening mammogram for malignant neoplasm of breast 01/03/2016  . History of bilateral saline breast implants 01/03/2016  . Encounter for screening for malignant neoplasm of colon 01/03/2016  . Encounter for screening for malignant neoplasm of cervix 01/03/2016  . 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 History of nephrolithiasis 06/22/2015  . Hypertension, goal below 140/90 06/22/2015  . Allergic rhinitis with postnasal drip 06/22/2015  . Foot pain, bilateral 06/22/2015  . History of Bell's palsy 09/14/2014  . 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 Allergic rhinitis with postnasal drip   . Cystadenofibroma of left ovary   . History of nephrolithiasis   . HTN, goal  below 140/90   . Kidney stone   . Left-sided Bell's palsy     Past Surgical History:  Procedure Laterality Date  . ABDOMINAL HYSTERECTOMY     vaginal; partial    Social History   Tobacco Use  . Smoking status: Never Smoker  . Smokeless tobacco: Never Used  Substance Use Topics  . Alcohol use: No    Alcohol/week: 0.0 standard drinks     Current Outpatient Medications:  .  amLODipine-benazepril (LOTREL) 5-10 MG capsule, Take 1 capsule by mouth daily., Disp: 90 capsule, Rfl: 3 .  cholecalciferol (VITAMIN D) 1000 units tablet, Take 1,000 Units by mouth daily., Disp: , Rfl:  .  fluticasone (FLONASE) 50 MCG/ACT nasal spray, Place 2 sprays into both nostrils daily., Disp: 16 g, Rfl: 11 .  meloxicam (MOBIC) 15 MG tablet, Take 1 tablet (15 mg total) by mouth daily., Disp: 30 tablet, Rfl: 0 .  Multiple Vitamins-Minerals (HAIR SKIN AND NAILS FORMULA PO), Take by mouth., Disp: , Rfl:  .  Multiple Vitamins-Minerals (MULTIPLE VITAMINS/WOMENS PO), Take by mouth., Disp: , Rfl:   Allergies  Allergen Reactions  . Sulfamethoxazole-Trimethoprim Rash    ROS    No other specific complaints in a complete review of systems (except as listed in HPI above).  Objective  Vitals:   04/21/19 0939  BP: 126/72  Pulse: 70  Resp: 14  Temp: 98.4 F (36.9 C)  TempSrc: Oral  SpO2: 97%  Weight: 131 lb 12.8 oz (59.8 kg)  Height: 5' 7.5" (1.715 m)     Body mass index is 20.34 kg/m.  Nursing Note and Vital Signs reviewed.  Physical Exam Constitutional:      Appearance: Normal appearance. She is well-developed.  HENT:     Head: Normocephalic and atraumatic.     Right Ear: Hearing normal.     Left Ear: Hearing normal.  Eyes:     Conjunctiva/sclera: Conjunctivae normal.  Cardiovascular:     Rate and Rhythm: Normal rate and regular rhythm.     Heart sounds: Normal heart sounds.  Pulmonary:     Effort: Pulmonary effort is normal.     Breath sounds: Normal breath sounds.  Genitourinary:    Musculoskeletal: Normal range of motion.  Neurological:     Mental Status: She is alert and oriented to person, place, and time.  Psychiatric:        Speech: Speech normal.        Behavior: Behavior normal. Behavior is cooperative.        Thought Content: Thought content normal.        Judgment: Judgment normal.        No results found for this or any previous visit (from the past 48 hour(s)).  Assessment & Plan  1. Folliculitis Hair follicle appears inflamed likely folliculitis vs abscess. Discussed prevention, taking probiotic for gut health.  - doxycycline (VIBRA-TABS) 100 MG tablet; Take 1 tablet (100 mg total) by mouth 2 (two) times daily.  Dispense: 20 tablet; Refill: 0

## 2019-04-21 NOTE — Patient Instructions (Signed)
-   Use warm moist heat to area 4-5 time a day to allow for drainage - use unscented soap to keep area clean and avoid irritation - can use sitz baths if having pain - take antibiotic as directed for entire course - If worsening at any point or not improving in one week return for re-evaluation.

## 2019-04-25 ENCOUNTER — Other Ambulatory Visit: Payer: Self-pay | Admitting: Family Medicine

## 2019-04-25 DIAGNOSIS — M7551 Bursitis of right shoulder: Secondary | ICD-10-CM

## 2019-05-20 ENCOUNTER — Ambulatory Visit: Payer: Self-pay

## 2019-05-20 NOTE — Telephone Encounter (Signed)
She does not have concerning uncontrolled comorbidity.  Further information can be found at the [facility] website

## 2019-05-20 NOTE — Telephone Encounter (Signed)
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 I advised I will send to the office and someone will call back with the provider's recommendation, she verbalized understanding.  Reason for Disposition . [1] Caller requesting NON-URGENT health information AND [2] PCP's office is the best resource . [1] Caller requesting NON-URGENT health information AND [2] PCP's office is the best resource  Protocols used: INFORMATION ONLY CALL - NO TRIAGE-A-AH, INFORMATION ONLY CALL-A-AH

## 2019-05-21 NOTE — Telephone Encounter (Signed)
Pt.notified

## 2019-05-27 ENCOUNTER — Ambulatory Visit: Payer: BC Managed Care – PPO | Admitting: Nurse Practitioner

## 2019-08-16 ENCOUNTER — Ambulatory Visit: Payer: BC Managed Care – PPO | Admitting: Family Medicine

## 2019-09-07 ENCOUNTER — Encounter (HOSPITAL_COMMUNITY): Payer: Self-pay

## 2019-09-07 ENCOUNTER — Ambulatory Visit (INDEPENDENT_AMBULATORY_CARE_PROVIDER_SITE_OTHER): Payer: BC Managed Care – PPO

## 2019-09-07 ENCOUNTER — Ambulatory Visit (HOSPITAL_COMMUNITY)
Admission: EM | Admit: 2019-09-07 | Discharge: 2019-09-07 | Disposition: A | Discharge status: Home / Self Care | Payer: BC Managed Care – PPO | Attending: Family Medicine | Admitting: Family Medicine

## 2019-09-07 ENCOUNTER — Other Ambulatory Visit: Payer: Self-pay

## 2019-09-07 DIAGNOSIS — M7551 Bursitis of right shoulder: Secondary | ICD-10-CM | POA: Diagnosis not present

## 2019-09-07 DIAGNOSIS — M79601 Pain in right arm: Secondary | ICD-10-CM | POA: Diagnosis not present

## 2019-09-07 DIAGNOSIS — M25511 Pain in right shoulder: Secondary | ICD-10-CM

## 2019-09-07 MED ORDER — TRAMADOL HCL 50 MG PO TABS: 50.0000 mg | ORAL_TABLET | Freq: Four times a day (QID) | ORAL | 0 refills | Status: DC | PRN

## 2019-09-07 MED ORDER — MELOXICAM 15 MG PO TABS: 15.0000 mg | ORAL_TABLET | Freq: Every day | ORAL | 0 refills | Status: DC

## 2019-09-07 NOTE — ED Provider Notes (Signed)
[name]   [id number] 09/07/19 Arrival Time: [id number]  ASSESSMENT & PLAN:  1. Acute pain of right shoulder   2. Right arm pain     I have personally viewed the imaging studies ordered this visit. No fractures appreciated.  To begin: Meds ordered this encounter  Medications  . meloxicam (MOBIC) 15 MG tablet    Sig: Take 1 tablet (15 mg total) by mouth daily.    Dispense:  20 tablet    Refill:  0  . traMADol (ULTRAM) 50 MG tablet    Sig: Take 1 tablet (50 mg total) by mouth every 6 (six) hours as needed.    Dispense:  15 tablet    Refill:  0    Orders Placed This Encounter  Procedures  . DG Shoulder Right  . DG Humerus Right    Recommend:  Follow-up Information    [place].   Why: If worsening or failing to improve as anticipated. Contact information: [place] [phone]         Encourage shoulder ROM as she tolerates.  [place] Controlled Substances Registry consulted for this patient. I feel the risk/benefit ratio today is favorable for proceeding with this prescription for a controlled substance. Medication sedation precautions given.  Reviewed expectations re: course of current medical issues. Questions answered. Outlined signs and symptoms indicating need for more acute intervention. Patient verbalized understanding. After Visit Summary given.  SUBJECTIVE: History from: patient. Teresa Dodson is a 58 y.o. female who reports fairly persistent moderate pain of her right shoulder and upper arm; described as aching; without radiation. Onset: abrupt. First noted: 3 d ago. Injury/trama: reports [place]; "think I twisted my shoulder as I fell". No wrist or hand pain. Symptoms have progressed to a point and plateaued since beginning. Aggravating factors: certain movements. Reports increased pain when trying to raise her right arm. Alleviating factors: rest. Associated  symptoms: none reported. Extremity sensation changes or weakness: none. Self treatment: Tramadol; single dose; some help. History of similar: no.  Past Surgical History:  Procedure Laterality Date  . ABDOMINAL HYSTERECTOMY     vaginal; partial     ROS: As per HPI. All other systems negative.    OBJECTIVE:  Vitals:   09/07/19 0949  BP: 130/75  Pulse: 63  Resp: 17  Temp: 98.6 F (37 C)  TempSrc: Oral  SpO2: 100%    General appearance: alert; no distress HEENT: [place]; AT Neck: supple with FROM CV: RRR Resp: unlabored respirations Extremities: (Difficult shoulder exam secondary to reported pain.) . RUE: warm with well perfused appearance; poorly localized marked tenderness over right anterior shoulder mostly; without gross deformities; swelling: none; bruising: none; shoulder ROM: significantly limited by pain CV: brisk extremity capillary refill of RUE; 2+ radial pulse of RUE. Skin: warm and dry; no visible rashes Neurologic: gait normal; normal reflexes of RUE; normal sensation of RUE; normal strength of RUE Psychological: alert and cooperative; normal mood and affect  Imaging: Dg Shoulder Right  Result Date: 09/07/2019 CLINICAL DATA:  Fall with shoulder injury, continued pain EXAM: RIGHT SHOULDER - 2+ VIEW COMPARISON:  None. FINDINGS: Anatomic alignment is maintained. There is no acute fracture. Calcification is present along the rotator cuff insertion. No intrinsic osseous lesion. Joint spaces are preserved. IMPRESSION: No acute fracture or malalignment.  Calcific tendinitis. Electronically Signed   By: Macy Mis M.D.   On: 09/07/2019 10:29   Dg Humerus Right  Result Date:  09/07/2019 CLINICAL DATA:  Recent fall with right arm pain, initial encounter EXAM: RIGHT HUMERUS - 2+ VIEW COMPARISON:  None. FINDINGS: Calcific changes are noted adjacent to the humeral head consistent with calcific tendinitis. Mild degenerative changes of the acromioclavicular joint are seen. No  humeral fracture is seen. No evidence of dislocation is noted. IMPRESSION: Chronic changes about the shoulder joint. No acute fracture is noted. Electronically Signed   By: Inez Catalina M.D.   On: 09/07/2019 10:17      Allergies  Allergen Reactions  . Sulfamethoxazole-Trimethoprim Rash    Past Medical History:  Diagnosis Date  . Allergic rhinitis with postnasal drip   . Cystadenofibroma of left ovary   . History of nephrolithiasis   . HTN, goal below 140/90   . Kidney stone   . Left-sided Bell's palsy    Social History   Socioeconomic History  . Marital status: Married    Spouse name: Audry Pili  . Number of children: 2  . Years of education: Not on file  . Highest education level: Bachelor's degree (e.g., BA, AB, BS)  Occupational History  . Not on file  Social Needs  . Financial resource strain: Not hard at all  . Food insecurity    Worry: Never true    Inability: Never true  . Transportation needs    Medical: No    Non-medical: No  Tobacco Use  . Smoking status: Never Smoker  . Smokeless tobacco: Never Used  Substance and Sexual Activity  . Alcohol use: No    Alcohol/week: 0.0 standard drinks  . Drug use: No  . Sexual activity: Yes    Partners: Male  Lifestyle  . Physical activity    Days per week: 7 days    Minutes per session: 30 min  . Stress: Not at all  Relationships  . Social connections    Talks on phone: More than three times a week    Gets together: Once a week    Attends religious service: More than 4 times per year    Active member of club or organization: Yes    Attends meetings of clubs or organizations: More than 4 times per year    Relationship status: Married  Other Topics Concern  . Not on file  Social History Narrative  . Not on file   Family History  Problem Relation Age of Onset  . Alzheimer's disease Mother   . Cancer Father        lung  . Diabetes Paternal Grandmother    Past Surgical History:  Procedure Laterality Date  .  ABDOMINAL HYSTERECTOMY     vaginal; partial      Vanessa Kick, MD 09/07/19 1103

## 2019-09-07 NOTE — ED Triage Notes (Signed)
Pt presents with right arm & shoulder injury after a fall on Saturday; pt states she is having a constant throbbing pain in her upper arm & shoulder.

## 2019-09-21 ENCOUNTER — Ambulatory Visit (INDEPENDENT_AMBULATORY_CARE_PROVIDER_SITE_OTHER): Payer: BC Managed Care – PPO | Admitting: Family Medicine

## 2019-09-21 ENCOUNTER — Other Ambulatory Visit: Payer: Self-pay

## 2019-09-21 ENCOUNTER — Encounter: Payer: Self-pay | Admitting: Family Medicine

## 2019-09-21 DIAGNOSIS — S4991XA Unspecified injury of right shoulder and upper arm, initial encounter: Secondary | ICD-10-CM

## 2019-09-21 DIAGNOSIS — S4991XD Unspecified injury of right shoulder and upper arm, subsequent encounter: Secondary | ICD-10-CM

## 2019-09-21 NOTE — Progress Notes (Signed)
Name: Teresa Dodson   MRN: [id number]    DOB: 03-22-1961   Date:09/21/2019       Progress Note  Subjective  Chief Complaint  Chief Complaint  Patient presents with  . Shoulder Pain    right shoulder, painful, cannot raise, seen at [facility]    I connected with  [name] on 09/21/19 at  7:20 AM EST by telephone and verified that I am speaking with the correct person using two identifiers.   I discussed the limitations, risks, security and privacy concerns of performing an evaluation and management service by telephone and the availability of in person appointments. Staff also discussed with the patient that there may be a patient responsible charge related to this service. Patient Location: Home Provider Location: Office Additional Individuals present: None  HPI  Pt presents with concern for ongoing right shoulder pain that started 09/04/2019.  She was seen 09/07/2019 at [facility] for the same and Rx's meloxicam and tramadol.  The pain started after a fall where she fell head first and the shoulder hit the ground.  She did have Xrays performed at that time which did not show any acute fracture.  She notes that her right arm is very painful with limited AROM - difficult to put deodorant on.  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  Denies numbness/tingling, weakened hand grip (does sometimes feel a bit "warm").    Patient Active Problem List   Diagnosis Date Noted  . Medication monitoring encounter 03/27/2017  . Annual physical exam 01/03/2016  . Encounter for screening mammogram for malignant neoplasm of breast 01/03/2016  . History of bilateral saline breast implants 01/03/2016  . Encounter for screening for malignant neoplasm of colon 01/03/2016  . Encounter for screening for malignant neoplasm of cervix 01/03/2016  . 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 History of nephrolithiasis 06/22/2015  . Hypertension, goal below 140/90 06/22/2015  . Allergic rhinitis with postnasal drip 06/22/2015  . Foot pain, bilateral 06/22/2015  . History of Bell's palsy 09/14/2014  . 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 Smoking status: Never Smoker  . Smokeless tobacco: Never Used  Substance Use Topics  . Alcohol use: No    Alcohol/week: 0.0 standard drinks     Current Outpatient Medications:  .  amLODipine-benazepril (LOTREL) 5-10 MG capsule, Take 1 capsule by mouth daily., Disp: 90 capsule, Rfl: 3 .  cholecalciferol (VITAMIN D) 1000 units tablet, Take 1,000 Units by mouth daily., Disp: , Rfl:  .  doxycycline (VIBRA-TABS) 100 MG tablet, Take 1 tablet (100 mg total) by mouth 2 (two) times daily., Disp: 20 tablet, Rfl: 0 .  meloxicam (MOBIC) 15 MG tablet, Take 1 tablet (15 mg total) by mouth daily., Disp: 20 tablet, Rfl: 0 .  Multiple Vitamins-Minerals (HAIR SKIN AND NAILS FORMULA PO), Take by mouth., Disp: , Rfl:  .  Multiple Vitamins-Minerals (MULTIPLE VITAMINS/WOMENS PO), Take by mouth., Disp: , Rfl:  .  fluticasone (FLONASE) 50 MCG/ACT nasal spray, Place 2 sprays into both nostrils daily. (Patient not taking: Reported on 09/21/2019), Disp: 16 g, Rfl: 11 .  traMADol (ULTRAM) 50 MG tablet, Take 1 tablet (50 mg total) by mouth every 6 (six) hours as needed., Disp: 15 tablet, Rfl: 0  Allergies  Allergen Reactions  .  Sulfamethoxazole-Trimethoprim Rash    I personally reviewed active problem list, medication list, allergies, notes from last encounter, lab results with the patient/caregiver today.  ROS  Ten systems reviewed and is negative except as mentioned in HPI  Objective  Virtual encounter, vitals not obtained.  There is no height or weight on file to calculate BMI.  Nursing Note and Vital Signs reviewed.  Physical Exam  Pulmonary/Chest: Effort normal. No respiratory distress. Speaking in  complete sentences Neurological: Pt is alert and oriented to person, place, and time. Speech  Psychiatric: Patient has a normal mood and affect. behavior is normal. Judgment and thought content normal.  No results found for this or any previous visit (from the past 72 hour(s)).  Assessment & Plan  1. Injury of right shoulder, subsequent encounter - Advised that my exam is extremely limited over the phone, any concerns for ability for work would need to be determined by Ortho; will try to obtain appointment in the next couple of days.  Recommend gentle stretching as she is able, ice/meloxicam/PRN tramadol for pain and inflammation. - Ambulatory referral to Orthopedics  -Red flags and when to present for emergency care or RTC including fever >101.76F, chest pain, shortness of breath, new/worsening/un-resolving symptoms, weakness/numbness/tingling of RUE reviewed with patient at time of visit. Follow up and care instructions discussed and provided in AVS. - I discussed the assessment and treatment plan with the patient. The patient was provided an opportunity to ask questions and all were answered. The patient agreed with the plan and demonstrated an understanding of the instructions.  - The patient was advised to call back or seek an in-person evaluation if the symptoms worsen or if the condition fails to improve as anticipated.  I provided 11 minutes of non-face-to-face time during this encounter.  Hubbard Hartshorn, FNP

## 2019-09-22 DIAGNOSIS — M7541 Impingement syndrome of right shoulder: Secondary | ICD-10-CM | POA: Insufficient documentation

## 2019-09-22 HISTORY — DX: Impingement syndrome of right shoulder: M75.41

## 2020-01-14 ENCOUNTER — Ambulatory Visit: Payer: Self-pay | Attending: Internal Medicine

## 2020-01-14 DIAGNOSIS — Z23 Encounter for immunization: Secondary | ICD-10-CM

## 2020-01-14 NOTE — Progress Notes (Signed)
   Covid-19 Vaccination Clinic  Name:  Teresa Dodson    MRN: [id number] DOB: [birth year]  01/14/2020  Teresa Dodson was observed post Covid-19 immunization for 15 minutes without incident. She was provided with Vaccine Information Sheet and instruction to access the V-Safe system.   Teresa Dodson was instructed to call 911 with any severe reactions post vaccine: Marland Kitchen Difficulty breathing  . Swelling of face and throat  . A fast heartbeat  . A bad rash all over body  . Dizziness and weakness   Immunizations Administered    Name Date Dose VIS Date Route   Moderna COVID-19 Vaccine 01/14/2020 10:45 AM 0.5 mL 10/05/2019 Intramuscular   Manufacturer: Moderna   Lot: [id number]   FraserBE:[id number]

## 2020-02-15 ENCOUNTER — Ambulatory Visit: Payer: Self-pay | Attending: Internal Medicine

## 2020-02-15 DIAGNOSIS — Z23 Encounter for immunization: Secondary | ICD-10-CM

## 2020-02-15 NOTE — Progress Notes (Signed)
   Covid-19 Vaccination Clinic  Name:  Avielle Rydman    MRN: [id number] DOB: 1961/02/27  02/15/2020  Ms. Mckeone was observed post Covid-19 immunization for 15 minutes without incident. She was provided with Vaccine Information Sheet and instruction to access the V-Safe system.   Ms. Prestage was instructed to call 911 with any severe reactions post vaccine: Marland Kitchen Difficulty breathing  . Swelling of face and throat  . A fast heartbeat  . A bad rash all over body  . Dizziness and weakness   Immunizations Administered    Name Date Dose VIS Date Route   Moderna COVID-19 Vaccine 02/15/2020 10:49 AM 0.5 mL 10/05/2019 Intramuscular   Manufacturer: Moderna   Lot: [id number]   [id number]

## 2020-02-16 ENCOUNTER — Other Ambulatory Visit: Payer: Self-pay | Admitting: Family Medicine

## 2020-02-16 NOTE — Telephone Encounter (Signed)
Pt needs appt  Hypertension medication request:03/31/19  Last office visit pertaining to hypertension:  BP Readings from Last 3 Encounters:  09/07/19 130/75  04/21/19 126/72  03/31/19 116/70    Lab Results  Component Value Date   CREATININE 0.96 05/12/2018   BUN 16 05/12/2018   NA 145 05/12/2018   K 4.0 05/12/2018   CL 107 05/12/2018   CO2 31 05/12/2018     No follow-ups on file.

## 2020-02-16 NOTE — Telephone Encounter (Signed)
FYI: pt already have an appointment scheduled for 4.22.2021

## 2020-02-16 NOTE — Telephone Encounter (Signed)
Requested medications are due for refill today?  Yes  Requested medications are on active medication list?  Yes  Last Refill: 02/16/2019  # 90 with 3 refills   Future visit scheduled? Yes  Notes to Clinic:  Medication failed RX refill protocol due to no lab work within 180 days and no valid encounter in past 6 months.    Last labs were performed on 05/12/2018.

## 2020-02-16 NOTE — Telephone Encounter (Signed)
Requested medications are due for refill today?  Yes  Requested medications are on active medication list?  Yes  Last Refill:  02/16/2019   # 90 with 3 refills   Future visit scheduled?  Yes  Notes to Clinic:  Medication failed RX refill protocol due to labs not being witthin 180 days.  Last labs were performed on 05/12/2018.    Requested Prescriptions  Pending Prescriptions Disp Refills   amLODipine-benazepril (LOTREL) 5-10 MG capsule [Pharmacy Med Name: AMLODIPINE-BENAZ 5/10MG  CAPSULES] 90 capsule 3    Sig: Take 1 capsule by mouth daily.      Cardiovascular: CCB + ACEI Combos Failed - 02/16/2020  3:18 AM      Failed - Cr in normal range and within 180 days    Creat  Date Value Ref Range Status  05/12/2018 0.96 0.50 - 1.05 mg/dL Final    Comment:    For patients >67 years of age, the reference limit for Creatinine is approximately 13% higher for people identified as African-American. .    Creatinine, Urine  Date Value Ref Range Status  05/12/2018 154 20 - 275 mg/dL Final          Failed - K in normal range and within 180 days    Potassium  Date Value Ref Range Status  05/12/2018 4.0 3.5 - 5.3 mmol/L Final          Failed - Valid encounter within last 6 months    Recent Outpatient Visits           4 months ago Injury of right shoulder, subsequent encounter   [place], FNP   10 months ago [place], NP   10 months ago Hypertension, goal below 140/90   [facility] Steele Sizer, MD   11 months ago Abscess of perineum   [place], NP   1 year ago Hypertension, goal below 140/90   [place], Satira Anis, MD       Future Appointments             In 1 week Delsa Grana, PA-C [facility], [place] - Patient is not pregnant      Passed - Last BP  in normal range    BP Readings from Last 1 Encounters:  09/07/19 130/75

## 2020-02-24 ENCOUNTER — Encounter: Payer: Self-pay | Admitting: Family Medicine

## 2020-02-24 ENCOUNTER — Ambulatory Visit (INDEPENDENT_AMBULATORY_CARE_PROVIDER_SITE_OTHER): Payer: 59 | Admitting: Family Medicine

## 2020-02-24 ENCOUNTER — Other Ambulatory Visit: Payer: Self-pay

## 2020-02-24 VITALS — BP 128/80 | HR 95 | Temp 97.9°F | Resp 14 | Ht 68.0 in | Wt 139.4 lb

## 2020-02-24 DIAGNOSIS — R413 Other amnesia: Secondary | ICD-10-CM

## 2020-02-24 DIAGNOSIS — E785 Hyperlipidemia, unspecified: Secondary | ICD-10-CM | POA: Diagnosis not present

## 2020-02-24 DIAGNOSIS — Z1231 Encounter for screening mammogram for malignant neoplasm of breast: Secondary | ICD-10-CM

## 2020-02-24 DIAGNOSIS — Z820 Family history of epilepsy and other diseases of the nervous system: Secondary | ICD-10-CM | POA: Insufficient documentation

## 2020-02-24 DIAGNOSIS — I10 Essential (primary) hypertension: Secondary | ICD-10-CM

## 2020-02-24 HISTORY — DX: Hyperlipidemia, unspecified: E78.5

## 2020-02-24 NOTE — Progress Notes (Signed)
Patient ID: Teresa Dodson, female    DOB: 1961/03/21, 59 y.o.   MRN: [id number]  PCP: Arnetha Courser, MD  Chief Complaint  Patient presents with  . Follow-up  . Memory Loss    mother had alzhmier's    Subjective:   Teresa Dodson is a 59 y.o. female, presents to clinic with CC of the following:  HPI  Pt is a Pharmacist, hospital for 20 years She is having trouble recalling things that she could recall before She is forgetting things more easily She remembers things like birthdays, SSN etc, but quick recall and things they were just discussing she has a hard time recalling.  She also has had some trouble with recall of things that she knows she knows.  She has not had any trouble losing things around her home or getting lost while driving, patient presents by herself without her husband or any other family members.  Her mother had alzheimer's,  In 1990-1995 she was diagnosed, she believes her mom may have been dx in her 61's  Patient has not had any work-up for this before, she reports working on puzzles and taking over-the-counter supplements to help with her cognitive function and memory she reports taking the following cognium supplement trevigen supplements  Screening today in office: 6CIT:  02/24/20 1422  6 CIT Score  What year is it? 0 points  What month is it? 0 points  Give patient an address phrase to remember (5 components) "no if's and's or buts"  About what time is it? 3 points  Count backwards from 20 to 1 0 points  Say the months of the year in reverse 2 points  Repeat the address phrase from earlier 10 points  6 CIT Score 15 points   Serial 7's -only able to calculate serial sevens from 100 x 2 WORLD backwards - good   PMHx had mild htn in the past, well controlled, no DM, HLD, no past cardiac issues or stroke/HA's imaging  Hypertension:  Currently managed on amlodipine benazepril 5-10 Pt reports good med compliance and denies any SE.  No lightheadedness,  hypotension, syncope. Blood pressure today is well controlled. BP Readings from Last 3 Encounters:  02/24/20 128/80  09/07/19 130/75  04/21/19 126/72   Pt denies CP, SOB, exertional sx, LE edema, palpitation, Ha's, visual disturbances Dietary efforts for BP? None  Is walking, doing puzzles  She denies any other health concerns and does not usually come in for routine physicals or for any screening.  She has a past history of hypertension she does not know if she has had a high cholesterol in the past, she asks if we recommend her coming for her complete physical examinations once a year Over the last year she is only been seen once or twice for acute exertion such as a rash and a shoulder injury Last blood work is from 2019  Patient Active Problem List   Diagnosis Date Noted  . History of bilateral saline breast implants 01/03/2016  . 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 History of nephrolithiasis 06/22/2015  . Hypertension, goal below 140/90 06/22/2015  . Allergic rhinitis with postnasal drip 06/22/2015  . Foot pain, bilateral 06/22/2015  . History of Bell's palsy 09/14/2014  . 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  amLODipine-benazepril (LOTREL) 5-10 MG capsule, TAKE 1 CAPSULE BY MOUTH DAILY, Disp: 30 capsule, Rfl: 0 .  cholecalciferol (VITAMIN D) 1000 units tablet, Take 1,000 Units by mouth  daily., Disp: , Rfl:  .  Multiple Vitamins-Minerals (HAIR SKIN AND NAILS FORMULA PO), Take by mouth., Disp: , Rfl:  .  Multiple Vitamins-Minerals (MULTIPLE VITAMINS/WOMENS PO), Take by mouth., Disp: , Rfl:    Allergies  Allergen Reactions  . Sulfamethoxazole-Trimethoprim Rash     Family History  Problem Relation Age of Onset  . Alzheimer's disease Mother   . Cancer Father        lung  . Diabetes Paternal Grandmother      Social History   Socioeconomic History  . Marital status: Married    Spouse name: Audry Pili  . Number of children: 2   . Years of education: Not on file  . Highest education level: Bachelor's degree (e.g., BA, AB, BS)  Occupational History  . Not on file  Tobacco Use  . Smoking status: Never Smoker  . Smokeless tobacco: Never Used  Substance and Sexual Activity  . Alcohol use: No    Alcohol/week: 0.0 standard drinks  . Drug use: No  . Sexual activity: Yes    Partners: Male  Other Topics Concern  . Not on file  Social History Narrative  . Not on file   Social Determinants of Health   Financial Resource Strain: Low Risk   . Difficulty of Paying Living Expenses: Not hard at all  Food Insecurity: No Food Insecurity  . Worried About Charity fundraiser in the Last Year: Never true  . Ran Out of Food in the Last Year: Never true  Transportation Needs: No Transportation Needs  . Lack of Transportation (Medical): No  . Lack of Transportation (Non-Medical): No  Physical Activity: Sufficiently Active  . Days of Exercise per Week: 7 days  . Minutes of Exercise per Session: 30 min  Stress: No Stress Concern Present  . Feeling of Stress : Not at all  Social Connections: Not Isolated  . Frequency of Communication with Friends and Family: More than three times a week  . Frequency of Social Gatherings with Friends and Family: Once a week  . Attends Religious Services: More than 4 times per year  . Active Member of Clubs or Organizations: Yes  . Attends Archivist Meetings: More than 4 times per year  . Marital Status: Married  Human resources officer Violence: Not At Risk  . Fear of Current or Ex-Partner: No  . Emotionally Abused: No  . Physically Abused: No  . Sexually Abused: No    Chart Review Today: I personally reviewed active problem list, medication list, allergies, family history, social history, health maintenance, notes from last encounter, lab results, imaging with the patient/caregiver today.   Review of Systems 10 Systems reviewed and are negative for acute change except as noted  in the HPI.     Objective:   Vitals:   02/24/20 1420  BP: 128/80  Pulse: 95  Resp: 14  Temp: 97.9 F (36.6 C)  SpO2: 98%  Weight: 139 lb 6.4 oz (63.2 kg)  Height: 5\' 8"  (1.727 m)    Body mass index is 21.2 kg/m.  Physical Exam Vitals and nursing note reviewed.  Constitutional:      General: She is not in acute distress.    Appearance: Normal appearance. She is not ill-appearing, toxic-appearing or diaphoretic.  HENT:     Head: Normocephalic and atraumatic.     Right Ear: External ear normal.     Left Ear: External ear normal.  Cardiovascular:     Rate and Rhythm: Normal rate and regular  rhythm.     Pulses: Normal pulses.     Heart sounds: Normal heart sounds.  Pulmonary:     Effort: Pulmonary effort is normal. No respiratory distress.     Breath sounds: Normal breath sounds. No wheezing, rhonchi or rales.  Abdominal:     General: Bowel sounds are normal. There is no distension.     Palpations: Abdomen is soft.     Tenderness: There is no abdominal tenderness.  Skin:    General: Skin is dry.     Coloration: Skin is not jaundiced or pale.     Findings: No erythema.  Neurological:     Mental Status: She is alert and oriented to person, place, and time.     Sensory: Sensation is intact.     Motor: Motor function is intact.     Coordination: Coordination is intact.     Gait: Gait is intact.     Comments: Left-sided Bell's palsy   Psychiatric:        Attention and Perception: Attention normal.        Mood and Affect: Mood normal.        Speech: Speech normal.        Behavior: Behavior is cooperative.        Cognition and Memory: Cognition is impaired. Memory is impaired.      Results for orders placed or performed in visit on [id number]  BASIC METABOLIC PANEL WITH GFR  Result Value Ref Range   Glucose, Bld 81 65 - 139 mg/dL   BUN 16 7 - 25 mg/dL   Creat 0.96 0.50 - 1.05 mg/dL   GFR, Est Non African American 66 > OR = 60 mL/min/1.55m2   GFR, Est African  American 76 > OR = 60 mL/min/1.10m2   BUN/Creatinine Ratio NOT APPLICABLE 6 - 22 (calc)   Sodium 145 135 - 146 mmol/L   Potassium 4.0 3.5 - 5.3 mmol/L   Chloride 107 98 - 110 mmol/L   CO2 31 20 - 32 mmol/L   Calcium 9.3 8.6 - 10.4 mg/dL  Urine Microalbumin w/creat. ratio  Result Value Ref Range   Creatinine, Urine 154 20 - 275 mg/dL   Microalb, Ur 1.1 mg/dL   Microalb Creat Ratio 7 <30 mcg/mg creat        Assessment & Plan:      ICD-10-CM   1. Memory problem  R41.3 Ambulatory referral to Neurology   Positive screening test here today poor recall and difficulty with some of the calculations and cognitive function screening questions  2. Family history of Alzheimer's disease  Z82.0 Ambulatory referral to Neurology   Alzheimer's in her mother in her 68s -patient will be referred to neurology.  3. Hypertension, goal below 140/90  99991111 COMPLETE METABOLIC PANEL WITH GFR   Stable, well-controlled  4. Hyperlipidemia, unspecified hyperlipidemia type  99991111 COMPLETE METABOLIC PANEL WITH GFR    Lipid panel   Patient not on any medications, prior elevated cholesterol we will recheck today, discussed lifestyle and diet changes and statin medications  5. Encounter for screening mammogram for breast cancer  Z12.31 MM 3D SCREEN BREAST BILATERAL        Delsa Grana, PA-C 02/24/20 2:31 PM

## 2020-02-24 NOTE — Patient Instructions (Signed)
See hand out on cognitive dysfunction or memory problems - there is some good but basic info on there.  Follow up with neurology for more in depth assessment.  See info below on cholesterol - you did have high cholesterol in 2017, I am rechecking today.  Lab Results  Component Value Date   CHOL 221 (H) 01/03/2016   HDL 91 01/03/2016   LDLCALC 117 (H) 01/03/2016   TRIG 64 01/03/2016   CHOLHDL 2.4 01/03/2016    High Cholesterol  High cholesterol is a condition in which the blood has high levels of a white, waxy, fat-like substance (cholesterol). The human body needs small amounts of cholesterol. The liver makes all the cholesterol that the body needs. Extra (excess) cholesterol comes from the food that we eat. Cholesterol is carried from the liver by the blood through the blood vessels. If you have high cholesterol, deposits (plaques) may build up on the walls of your blood vessels (arteries). Plaques make the arteries narrower and stiffer. Cholesterol plaques increase your risk for heart attack and stroke. Work with your health care provider to keep your cholesterol levels in a healthy range. What increases the risk? This condition is more likely to develop in people who:  Eat foods that are high in animal fat (saturated fat) or cholesterol.  Are overweight.  Are not getting enough exercise.  Have a family history of high cholesterol. What are the signs or symptoms? There are no symptoms of this condition. How is this diagnosed? This condition may be diagnosed from the results of a blood test.  If you are older than age 78, your health care provider may check your cholesterol every 4-6 years.  You may be checked more often if you already have high cholesterol or other risk factors for heart disease. The blood test for cholesterol measures:  "Bad" cholesterol (LDL cholesterol). This is the main type of cholesterol that causes heart disease. The desired level for LDL is less than  100.  "Good" cholesterol (HDL cholesterol). This type helps to protect against heart disease by cleaning the arteries and carrying the LDL away. The desired level for HDL is 60 or higher.  Triglycerides. These are fats that the body can store or burn for energy. The desired number for triglycerides is lower than 150.  Total cholesterol. This is a measure of the total amount of cholesterol in your blood, including LDL cholesterol, HDL cholesterol, and triglycerides. A healthy number is less than 200. How is this treated? This condition is treated with diet changes, lifestyle changes, and medicines. Diet changes  This may include eating more whole grains, fruits, vegetables, nuts, and fish.  This may also include cutting back on red meat and foods that have a lot of added sugar. Lifestyle changes  Changes may include getting at least 40 minutes of aerobic exercise 3 times a week. Aerobic exercises include walking, biking, and swimming. Aerobic exercise along with a healthy diet can help you maintain a healthy weight.  Changes may also include quitting smoking. Medicines  Medicines are usually given if diet and lifestyle changes have failed to reduce your cholesterol to healthy levels.  Your health care provider may prescribe a statin medicine. Statin medicines have been shown to reduce cholesterol, which can reduce the risk of heart disease. Follow these instructions at home: Eating and drinking If told by your health care provider:  Eat chicken (without skin), fish, veal, shellfish, ground [place] breast, and round or loin cuts of red meat.  Do not eat fried foods or fatty meats, such as hot dogs and salami.  Eat plenty of fruits, such as apples.  Eat plenty of vegetables, such as broccoli, potatoes, and carrots.  Eat beans, peas, and lentils.  Eat grains such as barley, rice, couscous, and bulgur wheat.  Eat pasta without cream sauces.  Use skim or nonfat milk, and eat  low-fat or nonfat yogurt and cheeses.  Do not eat or drink whole milk, cream, ice cream, egg yolks, or hard cheeses.  Do not eat stick margarine or tub margarines that contain trans fats (also called partially hydrogenated oils).  Do not eat saturated tropical oils, such as coconut oil and palm oil.  Do not eat cakes, cookies, crackers, or other baked goods that contain trans fats.  General instructions  Exercise as directed by your health care provider. Increase your activity level with activities such as gardening, walking, and taking the stairs.  Take over-the-counter and prescription medicines only as told by your health care provider.  Do not use any products that contain nicotine or tobacco, such as cigarettes and e-cigarettes. If you need help quitting, ask your health care provider.  Keep all follow-up visits as told by your health care provider. This is important. Contact a health care provider if:  You are struggling to maintain a healthy diet or weight.  You need help to start on an exercise program.  You need help to stop smoking. Get help right away if:  You have chest pain.  You have trouble breathing. This information is not intended to replace advice given to you by your health care provider. Make sure you discuss any questions you have with your health care provider. Document Revised: 10/24/2017 Document Reviewed: 04/20/2016 Elsevier Patient Education  [place].

## 2020-02-25 LAB — COMPLETE METABOLIC PANEL WITH GFR
AG Ratio: 1.6 (calc) (ref 1.0–2.5)
ALT: 19 U/L (ref 6–29)
AST: 27 U/L (ref 10–35)
Albumin: 4.4 g/dL (ref 3.6–5.1)
Alkaline phosphatase (APISO): 38 U/L (ref 37–153)
BUN: 15 mg/dL (ref 7–25)
CO2: 26 mmol/L (ref 20–32)
Calcium: 9.5 mg/dL (ref 8.6–10.4)
Chloride: 107 mmol/L (ref 98–110)
Creat: 0.95 mg/dL (ref 0.50–1.05)
GFR, Est African American: 76 mL/min/{1.73_m2} (ref >=60)
GFR, Est Non African American: 66 mL/min/{1.73_m2} (ref >=60)
Globulin: 2.7 g/dL (calc) (ref 1.9–3.7)
Glucose, Bld: 89 mg/dL (ref 65–99)
Potassium: 3.8 mmol/L (ref 3.5–5.3)
Sodium: 144 mmol/L (ref 135–146)
Total Bilirubin: 0.5 mg/dL (ref 0.2–1.2)
Total Protein: 7.1 g/dL (ref 6.1–8.1)

## 2020-02-25 LAB — LIPID PANEL
Cholesterol: 210 mg/dL — ABNORMAL HIGH (ref <200)
HDL: 73 mg/dL (ref >=50)
LDL Cholesterol (Calc): 116 mg/dL (calc) — ABNORMAL HIGH
Non-HDL Cholesterol (Calc): 137 mg/dL (calc) — ABNORMAL HIGH (ref <130)
Total CHOL/HDL Ratio: 2.9 (calc) (ref <5.0)
Triglycerides: 100 mg/dL (ref <150)

## 2020-02-29 MED ORDER — AMLODIPINE BESY-BENAZEPRIL HCL 5-10 MG PO CAPS: 1.0000 | ORAL_CAPSULE | Freq: Every day | ORAL | 3 refills | Status: DC

## 2020-03-22 DIAGNOSIS — R413 Other amnesia: Secondary | ICD-10-CM | POA: Insufficient documentation

## 2020-04-17 ENCOUNTER — Telehealth: Payer: Self-pay | Admitting: Family Medicine

## 2020-04-17 ENCOUNTER — Ambulatory Visit: Payer: Self-pay | Admitting: *Deleted

## 2020-04-17 NOTE — Telephone Encounter (Signed)
Patient c/o tingling in nostrils and slight burning and smell of ammonia for over a month. Patient reports when episode occurs she feels her upper body gets hot. appt made on 05/02/20. Care advise given and advised patient to call back or go to ED if symptoms worsen. Patient inquired about lab work for 7/22 appt. Recommended fasting prior to lab appt prior to physical.  Patient verbalized understanding.    Reason for Disposition . Nursing judgment or information in reference  Answer Assessment - Initial Assessment Questions 1. SYMPTOM: "What is the main symptom you are concerned about?" (e.g., weakness, numbness)     Ammonia smell and tingling in nostrils and upper body gets hot  2. ONSET: "When did this start?" (minutes, hours, days; while sleeping)     Over a month ago 3. LAST NORMAL: "When was the last time you were normal (no symptoms)?"     "quite a bit ago" 4. PATTERN "Does this come and go, or has it been constant since it started?"  "Is it present now?"     Comes and goes not now  5. CARDIAC SYMPTOMS: "Have you had any of the following symptoms: chest pain, difficulty breathing, palpitations?"     no 6. NEUROLOGIC SYMPTOMS: "Have you had any of the following symptoms: headache, dizziness, vision loss, double vision, changes in speech, unsteady on your feet?"     Light headache, loss of balance at times,  7. OTHER SYMPTOMS: "Do you have any other symptoms?"     no 8. PREGNANCY: "Is there any chance you are pregnant?" "When was your last menstrual period?"     No pregancy. hysterectomy  Answer Assessment - Initial Assessment Questions 1. REASON FOR CALL: "What is your main concern right now?"     Tingling in nostrils and some burning with smell of ammonia and upper body gets hot 2. ONSET: "When did the ammonia smell and tingling in nostril start?"     Over a month ago       4. FEVER: "Do you have a fever?"     no 5. OTHER SYMPTOMS: "Do you have any other new symptoms?"      Headache at times 6. INTERVENTIONS AND RESPONSE: "What have you done so far to try to make this better? What medications have you used?"      tylenol 7. PREGNANCY: "Is there any chance you are pregnant?"     No s/p hysterectomy  Protocols used: NO GUIDELINE AVAILABLE-A-AH, NEUROLOGIC DEFICIT-A-AH

## 2020-04-17 NOTE — Telephone Encounter (Signed)
Copied from [place] [id number]. Topic: General - Other >> Apr 17, 2020  9:01 AM Marya Landry D wrote: Reason for CRM: Patient is scheduled for a physical and wants to know if she has to fast prior to appt.please advise

## 2020-04-17 NOTE — Telephone Encounter (Signed)
Patient is experiencing a ammonia smell for a few weeks now and has been causing headaches,patient wants to know if anything can be prescribed or if she needs a office visit, please advise

## 2020-04-17 NOTE — Telephone Encounter (Signed)
See triage encounter on 04/17/20.

## 2020-04-17 NOTE — Telephone Encounter (Signed)
Attempted to return call to pt.  Left vm to return call to office to discuss her questions.

## 2020-04-19 ENCOUNTER — Other Ambulatory Visit: Payer: Self-pay | Admitting: Acute Care

## 2020-04-19 DIAGNOSIS — R413 Other amnesia: Secondary | ICD-10-CM

## 2020-05-02 ENCOUNTER — Telehealth (INDEPENDENT_AMBULATORY_CARE_PROVIDER_SITE_OTHER): Payer: 59 | Admitting: Family Medicine

## 2020-05-02 DIAGNOSIS — Z5329 Procedure and treatment not carried out because of patient's decision for other reasons: Secondary | ICD-10-CM

## 2020-05-03 ENCOUNTER — Other Ambulatory Visit: Payer: Self-pay

## 2020-05-03 ENCOUNTER — Ambulatory Visit
Admission: RE | Admit: 2020-05-03 | Discharge: 2020-05-03 | Disposition: A | Discharge status: Home / Self Care | Payer: 59 | Source: Ambulatory Visit | Attending: Acute Care | Admitting: Acute Care

## 2020-05-03 DIAGNOSIS — R413 Other amnesia: Secondary | ICD-10-CM | POA: Insufficient documentation

## 2020-05-25 ENCOUNTER — Ambulatory Visit (INDEPENDENT_AMBULATORY_CARE_PROVIDER_SITE_OTHER): Payer: 59 | Admitting: Family Medicine

## 2020-05-25 ENCOUNTER — Encounter: Payer: Self-pay | Admitting: Family Medicine

## 2020-05-25 ENCOUNTER — Other Ambulatory Visit: Payer: Self-pay

## 2020-05-25 VITALS — BP 128/82 | HR 88 | Temp 97.5°F | Resp 16 | Ht 68.0 in | Wt 140.8 lb

## 2020-05-25 DIAGNOSIS — Z0000 Encounter for general adult medical examination without abnormal findings: Secondary | ICD-10-CM

## 2020-05-25 DIAGNOSIS — N941 Unspecified dyspareunia: Secondary | ICD-10-CM

## 2020-05-25 DIAGNOSIS — Z1231 Encounter for screening mammogram for malignant neoplasm of breast: Secondary | ICD-10-CM

## 2020-05-25 MED ORDER — ESTROGENS, CONJUGATED 0.625 MG/GM VA CREA: TOPICAL_CREAM | VAGINAL | 5 refills | Status: DC

## 2020-05-25 NOTE — Progress Notes (Signed)
Patient: Teresa Dodson, Female    DOB: 12/04/1960, 59 y.o.   MRN: [id number] Delsa Grana, PA-C Visit Date: 05/25/2020  Today's Provider: Delsa Grana, PA-C   Chief Complaint  Patient presents with  . Annual Exam    Pap? last one 2017 did she have total hysterectomy since?   Subjective:   Annual physical exam:  Teresa Dodson is a 59 y.o. female who presents today for complete physical exam:  Exercise/Activity:  Basketball, walking, chases grandkids and walks dogs Diet/nutrition:  Eat what she wants to eat Sleep:  Sleeps well 8 h  Hysterectomy total  USPSTF grade A and B recommendations - reviewed and addressed today  Depression:  Phq 9 completed today by patient, was reviewed by me with patient in the room PHQ score is neg, pt feels good PHQ 2/9 Scores 05/25/2020 02/24/2020 09/21/2019 04/21/2019  PHQ - 2 Score 0 0 2 0  PHQ- 9 Score 0 0 2 0   Depression screen [facility] 2/9 05/25/2020 02/24/2020 09/21/2019 04/21/2019 03/31/2019  Decreased Interest 0 0 1 0 0  Down, Depressed, Hopeless 0 0 1 0 0  PHQ - 2 Score 0 0 2 0 0  Altered sleeping 0 0 0 0 0  Tired, decreased energy 0 0 0 0 0  Change in appetite 0 0 0 0 0  Feeling bad or failure about yourself  0 0 0 0 0  Trouble concentrating 0 0 0 0 0  Moving slowly or fidgety/restless 0 0 0 0 0  Suicidal thoughts 0 0 0 0 0  PHQ-9 Score 0 0 2 0 0  Difficult doing work/chores Not difficult at all Not difficult at all Not difficult at all Not difficult at all Not difficult at all    Alcohol screening:   Office Visit from 05/25/2020 in [facility]  AUDIT-C Score 0      Immunizations and Health Maintenance: Health Maintenance  Topic Date Due  . MAMMOGRAM  11/04/2012  . INFLUENZA VACCINE  06/04/2020  . Fecal DNA (Cologuard)  12/18/2020  . TETANUS/TDAP  03/04/2027  . COVID-19 Vaccine  Completed  . Hepatitis C Screening  Completed  . HIV Screening  Completed  . PAP SMEAR-Modifier  Discontinued     Hep C  Screening: done  STD testing and prevention (HIV/chl/gon/syphilis):  see above, no additional testing desired by pt today- done,  Intimate partner violence: safe at home with spouse  Sexual History/Pain during Intercourse: Married - some discomfort occasionlly   Menstrual History/LMP/Abnormal Bleeding: none No LMP recorded. Patient has had a hysterectomy.  Incontinence Symptoms: none  Breast cancer:  DUE - ordered  Last Mammogram: *see HM list above BRCA gene screening: none  Cervical cancer screening: hysterectomy Pt denies family hx of cancers - breast, ovarian, uterine, colon:     Osteoporosis:   Discussion on osteoporosis per age, including high calcium and vitamin D supplementation, weight bearing exercises   Skin cancer:  Hx of skin CA -  No Discussed atypical lesions   Colorectal cancer:   Colonoscopy is utd Discussed concerning signs and sx of CRC, pt denies melena, hematochezia, change in bowels  Lung cancer:   Low Dose CT Chest recommended if Age 33-80 years, 30 pack-year currently smoking OR have quit w/in 15years. Patient does not qualify.    Social History   Tobacco Use  . Smoking status: Never Smoker  . Smokeless tobacco: Never Used  Vaping Use  . Vaping Use: Never used  Substance Use  Topics  . Alcohol use: No    Alcohol/week: 0.0 standard drinks  . Drug use: No       Office Visit from 05/25/2020 in [facility]  AUDIT-C Score 0      Family History  Problem Relation Age of Onset  . Alzheimer's disease Mother   . Cancer Father        lung  . Diabetes Paternal Grandmother      Blood pressure/Hypertension: BP Readings from Last 3 Encounters:  05/25/20 128/82  02/24/20 128/80  09/07/19 130/75    Weight/Obesity: Wt Readings from Last 3 Encounters:  05/25/20 140 lb 12.8 oz (63.9 kg)  02/24/20 139 lb 6.4 oz (63.2 kg)  04/21/19 131 lb 12.8 oz (59.8 kg)   BMI Readings from Last 3 Encounters:  05/25/20 21.41 kg/m    02/24/20 21.20 kg/m  04/21/19 20.34 kg/m     Lipids:  Lab Results  Component Value Date   CHOL 210 (H) 02/24/2020   CHOL 221 (H) 01/03/2016   Lab Results  Component Value Date   HDL 73 02/24/2020   HDL 91 01/03/2016   Lab Results  Component Value Date   LDLCALC 116 (H) 02/24/2020   LDLCALC 117 (H) 01/03/2016   Lab Results  Component Value Date   TRIG 100 02/24/2020   TRIG 64 01/03/2016   Lab Results  Component Value Date   CHOLHDL 2.9 02/24/2020   CHOLHDL 2.4 01/03/2016   No results found for: LDLDIRECT Based on the results of lipid panel his/her cardiovascular risk factor ( using [place] )  in the next 10 years is: The 10-year ASCVD risk score Mikey Bussing DC Brooke Bonito., et al., 2013) is: 5.6%   Values used to calculate the score:     Age: 29 years     Sex: Female     Is Non-Hispanic African American: Yes     Diabetic: No     Tobacco smoker: No     Systolic Blood Pressure: 935 mmHg     Is BP treated: Yes     HDL Cholesterol: 73 mg/dL     Total Cholesterol: 210 mg/dL Glucose:  Glucose, Bld  Date Value Ref Range Status  02/24/2020 89 65 - 99 mg/dL Final    Comment:    .            Fasting reference interval .   05/12/2018 81 65 - 139 mg/dL Final    Comment:    .        Non-fasting reference interval .   11/25/2017 83 65 - 99 mg/dL Final    Comment:    .            Fasting reference interval .    Hypertension: BP Readings from Last 3 Encounters:  05/25/20 128/82  02/24/20 128/80  09/07/19 130/75   Obesity: Wt Readings from Last 3 Encounters:  05/25/20 140 lb 12.8 oz (63.9 kg)  02/24/20 139 lb 6.4 oz (63.2 kg)  04/21/19 131 lb 12.8 oz (59.8 kg)   BMI Readings from Last 3 Encounters:  05/25/20 21.41 kg/m  02/24/20 21.20 kg/m  04/21/19 20.34 kg/m    The 10-year ASCVD risk score Mikey Bussing DC Jr., et al., 2013) is: 5.6%   Values used to calculate the score:     Age: 63 years     Sex: Female     Is Non-Hispanic African American: Yes      Diabetic: No     Tobacco smoker:  No     Systolic Blood Pressure: 245 mmHg     Is BP treated: Yes     HDL Cholesterol: 73 mg/dL     Total Cholesterol: 210 mg/dL   Advanced Care Planning:  A voluntary discussion about advance care planning including the explanation and discussion of advance directives.   Discussed health care proxy and Living will, and the patient was able to identify a health care proxy as husband.   Patient does not have a living will at present time.   Social History      She        Social History   Socioeconomic History  . Marital status: Married    Spouse name: Audry Pili  . Number of children: 2  . Years of education: Not on file  . Highest education level: Bachelor's degree (e.g., BA, AB, BS)  Occupational History  . Not on file  Tobacco Use  . Smoking status: Never Smoker  . Smokeless tobacco: Never Used  Vaping Use  . Vaping Use: Never used  Substance and Sexual Activity  . Alcohol use: No    Alcohol/week: 0.0 standard drinks  . Drug use: No  . Sexual activity: Yes    Partners: Male  Other Topics Concern  . Not on file  Social History Narrative  . Not on file   Social Determinants of Health   Financial Resource Strain:   . Difficulty of Paying Living Expenses:   Food Insecurity:   . Worried About Charity fundraiser in the Last Year:   . Arboriculturist in the Last Year:   Transportation Needs:   . Film/video editor (Medical):   Marland Kitchen Lack of Transportation (Non-Medical):   Physical Activity:   . Days of Exercise per Week:   . Minutes of Exercise per Session:   Stress:   . Feeling of Stress :   Social Connections:   . Frequency of Communication with Friends and Family:   . Frequency of Social Gatherings with Friends and Family:   . Attends Religious Services:   . Active Member of Clubs or Organizations:   . Attends Archivist Meetings:   Marland Kitchen Marital Status:     Family History        Family History  Problem Relation Age  of Onset  . Alzheimer's disease Mother   . Cancer Father        lung  . Diabetes Paternal Grandmother     Patient Active Problem List   Diagnosis Date Noted  . 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 Family history of Alzheimer's disease 02/24/2020  . Hyperlipidemia 02/24/2020  . Impingement syndrome of right shoulder region 09/22/2019  . History of bilateral saline breast implants 01/03/2016  . 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 History of nephrolithiasis 06/22/2015  . Hypertension, goal below 140/90 06/22/2015  . Allergic rhinitis with postnasal drip 06/22/2015  . Foot pain, bilateral 06/22/2015  . History of Bell's palsy 09/14/2014  . 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 ABDOMINAL HYSTERECTOMY     vaginal; partial     Current Outpatient Medications:  .  amLODipine-benazepril (LOTREL) 5-10 MG capsule, Take 1 capsule by mouth daily., Disp: 90 capsule, Rfl: 3 .  cholecalciferol (VITAMIN D) 1000 units tablet, Take 1,000 Units by mouth daily., Disp: , Rfl:  .  donepezil (ARICEPT) 5 MG tablet, Take by mouth., Disp: , Rfl:  .  Multiple  Vitamins-Minerals (HAIR SKIN AND NAILS FORMULA PO), Take by mouth., Disp: , Rfl:  .  Multiple Vitamins-Minerals (MULTIPLE VITAMINS/WOMENS PO), Take by mouth., Disp: , Rfl:  .  conjugated estrogens (PREMARIN) vaginal cream, Place 1 applicatorful vaginally daily x 1 week, then do twice weekly thereafter for atrophic vaginitis and dysparenia, Disp: 42.5 g, Rfl: 5  Allergies  Allergen Reactions  . Sulfamethoxazole-Trimethoprim Rash    Patient Care Team: Delsa Grana, PA-C as PCP - General (Family Medicine)  Review of Systems  Constitutional: Negative.  Negative for activity change, appetite change, fatigue and unexpected weight change.  HENT: Negative.   Eyes: Negative.   Respiratory: Negative.  Negative for shortness of breath.   Cardiovascular: Negative.  Negative for chest pain,  palpitations and leg swelling.  Gastrointestinal: Negative.  Negative for abdominal pain and blood in stool.  Endocrine: Negative.   Genitourinary: Negative.   Musculoskeletal: Negative.  Negative for arthralgias, gait problem, joint swelling and myalgias.  Skin: Negative.  Negative for color change, pallor and rash.  Allergic/Immunologic: Negative.   Neurological: Negative.  Negative for syncope and weakness.  Hematological: Negative.   Psychiatric/Behavioral: Negative.  Negative for confusion, dysphoric mood, self-injury and suicidal ideas. The patient is not nervous/anxious.      I personally reviewed active problem list, medication list, allergies, family history, social history, health maintenance, notes from last encounter, lab results, imaging with the patient/caregiver today.        Objective:   Vitals:  Vitals:   05/25/20 1059  BP: 128/82  Pulse: 88  Resp: 16  Temp: (!) 97.5 F (36.4 C)  TempSrc: Temporal  SpO2: 97%  Weight: 140 lb 12.8 oz (63.9 kg)  Height: 5' 8"  (1.727 m)    Body mass index is 21.41 kg/m.  Physical Exam Vitals and nursing note reviewed.  Constitutional:      General: She is not in acute distress.    Appearance: Normal appearance. She is well-developed. She is not toxic-appearing or diaphoretic.  HENT:     Head: Normocephalic and atraumatic.     Right Ear: External ear normal.     Left Ear: External ear normal.     Nose: Nose normal.     Mouth/Throat:     Pharynx: Uvula midline.  Eyes:     General: Lids are normal.     Conjunctiva/sclera: Conjunctivae normal.     Pupils: Pupils are equal, round, and reactive to light.  Neck:     Trachea: Phonation normal. No tracheal deviation.  Cardiovascular:     Rate and Rhythm: Normal rate and regular rhythm.     Pulses: Normal pulses.          Radial pulses are 2+ on the right side and 2+ on the left side.       Posterior tibial pulses are 2+ on the right side and 2+ on the left side.     Heart  sounds: Normal heart sounds. No murmur heard.  No friction rub. No gallop.   Pulmonary:     Effort: Pulmonary effort is normal. No respiratory distress.     Breath sounds: Normal breath sounds. No stridor. No wheezing, rhonchi or rales.  Chest:     Chest wall: No tenderness.     Breasts:        Right: No swelling, bleeding, inverted nipple, mass, nipple discharge, skin change or tenderness.        Left: No swelling, bleeding, inverted nipple, mass, nipple discharge, skin change or tenderness.  Abdominal:     General: Bowel sounds are normal. There is no distension.     Palpations: Abdomen is soft.     Tenderness: There is no abdominal tenderness. There is no guarding or rebound.  Musculoskeletal:        General: No deformity. Normal range of motion.     Cervical back: Normal range of motion and neck supple.  Lymphadenopathy:     Cervical: No cervical adenopathy.     Upper Body:     Right upper body: No supraclavicular, axillary or pectoral adenopathy.     Left upper body: No supraclavicular, axillary or pectoral adenopathy.  Skin:    General: Skin is warm and dry.     Capillary Refill: Capillary refill takes less than 2 seconds.     Coloration: Skin is not pale.     Findings: No rash.  Neurological:     Mental Status: She is alert and oriented to person, place, and time.     Motor: No abnormal muscle tone.     Gait: Gait normal.  Psychiatric:        Speech: Speech normal.        Behavior: Behavior normal.       Fall Risk: Fall Risk  05/25/2020 02/24/2020 09/21/2019 04/21/2019 03/31/2019  Falls in the past year? 0 1 1 0 0  Number falls in past yr: 0 0 0 0 0  Injury with Fall? 0 1 1 0 0  Risk for fall due to : - History of fall(s) History of fall(s) - -  Follow up Falls evaluation completed Education provided;Falls prevention discussed;Falls evaluation completed Falls evaluation completed - -    Functional Status Survey: Is the patient deaf or have difficulty hearing?:  No Does the patient have difficulty seeing, even when wearing glasses/contacts?: No Does the patient have difficulty concentrating, remembering, or making decisions?: No Does the patient have difficulty walking or climbing stairs?: No Does the patient have difficulty dressing or bathing?: No Does the patient have difficulty doing errands alone such as visiting a doctor's office or shopping?: No   Assessment & Plan:    CPE completed today  . USPSTF grade A and B recommendations reviewed with patient; age-appropriate recommendations, preventive care, screening tests, etc discussed and encouraged; healthy living encouraged; see AVS for patient education given to patient  . Discussed importance of 150 minutes of physical activity weekly, AHA exercise recommendations given to pt in AVS/handout  . Discussed importance of healthy diet:  eating lean meats and proteins, avoiding trans fats and saturated fats, avoid simple sugars and excessive carbs in diet, eat 6 servings of fruit/vegetables daily and drink plenty of water and avoid sweet beverages.    . Recommended pt to do annual eye exam and routine dental exams/cleanings  . Depression, alcohol, fall screening completed as documented above and per flowsheets  . Reviewed Health Maintenance: Health Maintenance  Topic Date Due  . MAMMOGRAM  11/04/2012  . INFLUENZA VACCINE  06/04/2020  . Fecal DNA (Cologuard)  12/18/2020  . TETANUS/TDAP  03/04/2027  . COVID-19 Vaccine  Completed  . Hepatitis C Screening  Completed  . HIV Screening  Completed  . PAP SMEAR-Modifier  Discontinued    . Immunizations: Immunization History  Administered Date(s) Administered  . Influenza Inj Mdck Quad Pf 08/02/2019  . Influenza,inj,Quad PF,6+ Mos 01/03/2016, 08/19/2016, 08/08/2017, 08/10/2018  . Moderna SARS-COVID-2 Vaccination 01/14/2020, 02/15/2020  . Tdap 03/03/2017      ICD-10-CM   1. Adult general  medical exam  Z00.00   2. Encounter for screening  mammogram for malignant neoplasm of breast  Z12.31 MM 3D SCREEN BREAST BILATERAL  3. Dyspareunia in female  N94.10 conjugated estrogens (PREMARIN) vaginal cream   Reviewed her recent labs    Delsa Grana, Hershal Coria 05/25/20 7:10 PM  [facility]

## 2020-05-25 NOTE — Patient Instructions (Addendum)
06/28/2020 Office Visit Neurology Melrose Nakayama, Liliane Shi, [place]  [facility]  [place], [place] [address]  [phone]  [phone] (Fax)     [facility] [place],  [place]  [id number] Get Driving Directions Main: [phone]    Preventive Care 48-60 Years Old, Female Preventive care refers to visits with your health care provider and lifestyle choices that can promote health and wellness. This includes:  A yearly physical exam. This may also be called an annual well check.  Regular dental visits and eye exams.  Immunizations.  Screening for certain conditions.  Healthy lifestyle choices, such as eating a healthy diet, getting regular exercise, not using drugs or products that contain nicotine and tobacco, and limiting alcohol use. What can I expect for my preventive care visit? Physical exam Your health care provider will check your:  Height and weight. This may be used to calculate body mass index (BMI), which tells if you are at a healthy weight.  Heart rate and blood pressure.  Skin for abnormal spots. Counseling Your health care provider may ask you questions about your:  Alcohol, tobacco, and drug use.  Emotional well-being.  Home and relationship well-being.  Sexual activity.  Eating habits.  Work and work Statistician.  Method of birth control.  Menstrual cycle.  Pregnancy history. What immunizations do I need?  Influenza (flu) vaccine  This is recommended every year. Tetanus, diphtheria, and pertussis (Tdap) vaccine  You may need a Td booster every 10 years. Varicella (chickenpox) vaccine  You may need this if you have not been vaccinated. Zoster (shingles) vaccine  You may need this after age 42. Measles, mumps, and rubella (MMR) vaccine  You may need at least one dose of MMR if you were born in 1957 or later. You may also need a second  dose. Pneumococcal conjugate (PCV13) vaccine  You may need this if you have certain conditions and were not previously vaccinated. Pneumococcal polysaccharide (PPSV23) vaccine  You may need one or two doses if you smoke cigarettes or if you have certain conditions. Meningococcal conjugate (MenACWY) vaccine  You may need this if you have certain conditions. Hepatitis A vaccine  You may need this if you have certain conditions or if you travel or work in places where you may be exposed to hepatitis A. Hepatitis B vaccine  You may need this if you have certain conditions or if you travel or work in places where you may be exposed to hepatitis B. Haemophilus influenzae type b (Hib) vaccine  You may need this if you have certain conditions. Human papillomavirus (HPV) vaccine  If recommended by your health care provider, you may need three doses over 6 months. You may receive vaccines as individual doses or as more than one vaccine together in one shot (combination vaccines). Talk with your health care provider about the risks and benefits of combination vaccines. What tests do I need? Blood tests  Lipid and cholesterol levels. These may be checked every 5 years, or more frequently if you are over 65 years old.  Hepatitis C test.  Hepatitis B test. Screening  Lung cancer screening. You may have this screening every year starting at age 20 if you have a 30-pack-year history of smoking and currently smoke or have quit within the past 15 years.  Colorectal cancer screening. All adults should have this screening starting at age 40 and continuing until age 82. Your health care provider  may recommend screening at age 56 if you are at increased risk. You will have tests every 1-10 years, depending on your results and the type of screening test.  Diabetes screening. This is done by checking your blood sugar (glucose) after you have not eaten for a while (fasting). You may have this done every  1-3 years.  Mammogram. This may be done every 1-2 years. Talk with your health care provider about when you should start having regular mammograms. This may depend on whether you have a family history of breast cancer.  BRCA-related cancer screening. This may be done if you have a family history of breast, ovarian, tubal, or peritoneal cancers.  Pelvic exam and Pap test. This may be done every 3 years starting at age 46. Starting at age 1, this may be done every 5 years if you have a Pap test in combination with an HPV test. Other tests  Sexually transmitted disease (STD) testing.  Bone density scan. This is done to screen for osteoporosis. You may have this scan if you are at high risk for osteoporosis. Follow these instructions at home: Eating and drinking  Eat a diet that includes fresh fruits and vegetables, whole grains, lean protein, and low-fat dairy.  Take vitamin and mineral supplements as recommended by your health care provider.  Do not drink alcohol if: ? Your health care provider tells you not to drink. ? You are pregnant, may be pregnant, or are planning to become pregnant.  If you drink alcohol: ? Limit how much you have to 0-1 drink a day. ? Be aware of how much alcohol is in your drink. In the [place] one drink equals one 12 oz bottle of beer (355 mL), one 5 oz glass of wine (148 mL), or one 1 oz glass of hard liquor (44 mL). Lifestyle  Take daily care of your teeth and gums.  Stay active. Exercise for at least 30 minutes on 5 or more days each week.  Do not use any products that contain nicotine or tobacco, such as cigarettes, e-cigarettes, and chewing tobacco. If you need help quitting, ask your health care provider.  If you are sexually active, practice safe sex. Use a condom or other form of birth control (contraception) in order to prevent pregnancy and STIs (sexually transmitted infections).  If told by your health care provider, take low-dose aspirin daily  starting at age 49. What's next?  Visit your health care provider once a year for a well check visit.  Ask your health care provider how often you should have your eyes and teeth checked.  Stay up to date on all vaccines. This information is not intended to replace advice given to you by your health care provider. Make sure you discuss any questions you have with your health care provider. Document Revised: 07/02/2018 Document Reviewed: 07/02/2018 Elsevier Patient Education  2020 [employer].

## 2020-06-19 NOTE — Progress Notes (Signed)
No OV done- closing encounter

## 2020-08-02 ENCOUNTER — Other Ambulatory Visit: Payer: Self-pay

## 2020-08-02 DIAGNOSIS — Z111 Encounter for screening for respiratory tuberculosis: Secondary | ICD-10-CM

## 2020-08-03 ENCOUNTER — Ambulatory Visit (INDEPENDENT_AMBULATORY_CARE_PROVIDER_SITE_OTHER): Payer: 59

## 2020-08-03 ENCOUNTER — Other Ambulatory Visit: Payer: Self-pay

## 2020-08-03 DIAGNOSIS — Z23 Encounter for immunization: Secondary | ICD-10-CM | POA: Diagnosis not present

## 2020-08-03 NOTE — Progress Notes (Signed)
Hep B and influenza vaccine given with no adverse reactions immediatly noted.

## 2020-08-03 NOTE — Patient Instructions (Signed)
Influenza (Flu) Vaccine (Inactivated or Recombinant): What You Need to Know 1. Why get vaccinated? Influenza vaccine can prevent influenza (flu). Flu is a contagious disease that spreads around the [place] every year, usually between October and May. Anyone can get the flu, but it is more dangerous for some people. Infants and young children, people 60 years of age and older, pregnant women, and people with certain health conditions or a weakened immune system are at greatest risk of flu complications. Pneumonia, bronchitis, sinus infections and ear infections are examples of flu-related complications. If you have a medical condition, such as heart disease, cancer or diabetes, flu can make it worse. Flu can cause fever and chills, sore throat, muscle aches, fatigue, cough, headache, and runny or stuffy nose. Some people may have vomiting and diarrhea, though this is more common in children than adults. Each year thousands of people in the [place] die from flu, and many more are hospitalized. Flu vaccine prevents millions of illnesses and flu-related visits to the doctor each year. 2. Influenza vaccine CDC recommends everyone 53 months of age and older get vaccinated every flu season. Children 6 months through 91 years of age may need 2 doses during a single flu season. Everyone else needs only 1 dose each flu season. It takes about 2 weeks for protection to develop after vaccination. There are many flu viruses, and they are always changing. Each year a new flu vaccine is made to protect against three or four viruses that are likely to cause disease in the upcoming flu season. Even when the vaccine doesn't exactly match these viruses, it may still provide some protection. Influenza vaccine does not cause flu. Influenza vaccine may be given at the same time as other vaccines. 3. Talk with your health care provider Tell your vaccine provider if the person getting the vaccine:  Has had an  allergic reaction after a previous dose of influenza vaccine, or has any severe, life-threatening allergies.  Has ever had Guillain-Barr Syndrome (also called GBS). In some cases, your health care provider may decide to postpone influenza vaccination to a future visit. People with minor illnesses, such as a cold, may be vaccinated. People who are moderately or severely ill should usually wait until they recover before getting influenza vaccine. Your health care provider can give you more information. 4. Risks of a vaccine reaction  Soreness, redness, and swelling where shot is given, fever, muscle aches, and headache can happen after influenza vaccine.  There may be a very small increased risk of Guillain-Barr Syndrome (GBS) after inactivated influenza vaccine (the flu shot). Young children who get the flu shot along with pneumococcal vaccine (PCV13), and/or DTaP vaccine at the same time might be slightly more likely to have a seizure caused by fever. Tell your health care provider if a child who is getting flu vaccine has ever had a seizure. People sometimes faint after medical procedures, including vaccination. Tell your provider if you feel dizzy or have vision changes or ringing in the ears. As with any medicine, there is a very remote chance of a vaccine causing a severe allergic reaction, other serious injury, or death. 5. What if there is a serious problem? An allergic reaction could occur after the vaccinated person leaves the clinic. If you see signs of a severe allergic reaction (hives, swelling of the face and throat, difficulty breathing, a fast heartbeat, dizziness, or weakness), call 9-1-1 and get the person to the nearest hospital. For other signs that  concern you, call your health care provider. Adverse reactions should be reported to the Vaccine Adverse Event Reporting System (VAERS). Your health care provider will usually file this report, or you can do it yourself. Visit the  VAERS website at www.vaers.SamedayNews.es or call 913-475-5469.VAERS is only for reporting reactions, and VAERS staff do not give medical advice. 6. The National Vaccine Injury Compensation Program The [employer] Vaccine Injury Compensation Program (VICP) is a federal program that was created to compensate people who may have been injured by certain vaccines. Visit the VICP website at GoldCloset.com.ee or call (564)116-9064 to learn about the program and about filing a claim. There is a time limit to file a claim for compensation. 7. How can I learn more?  Ask your healthcare provider.  Call your local or state health department.  Contact the Centers for Disease Control and Prevention (CDC): ? Call 702-117-2727 (1-800-CDC-INFO) or ? Visit CDC's https://gibson.com/ Vaccine Information Statement (Interim) Inactivated Influenza Vaccine (06/18/2018) This information is not intended to replace advice given to you by your health care provider. Make sure you discuss any questions you have with your health care provider. Document Revised: 02/09/2019 Document Reviewed: 06/22/2018 Elsevier Patient Education  [place]. Hepatitis B Vaccine, Recombinant injection What is this medicine? HEPATITIS B VACCINE (hep uh TAHY tis B VAK seen) is a vaccine. It is used to prevent an infection with the hepatitis B virus. This medicine may be used for other purposes; ask your health care provider or pharmacist if you have questions. COMMON BRAND NAME(S): Engerix-B, Recombivax HB What should I tell my health care provider before I take this medicine? They need to know if you have any of these conditions:  fever, infection  heart disease  hepatitis B infection  immune system problems  kidney disease  an unusual or allergic reaction to vaccines, yeast, other medicines, foods, dyes, or preservatives  pregnant or trying to get pregnant  breast-feeding How should I use this medicine? This  vaccine is for injection into a muscle. It is given by a health care professional. A copy of Vaccine Information Statements will be given before each vaccination. Read this sheet carefully each time. The sheet may change frequently. Talk to your pediatrician regarding the use of this medicine in children. While this drug may be prescribed for children as young as newborn for selected conditions, precautions do apply. Overdosage: If you think you have taken too much of this medicine contact a poison control center or emergency room at once. NOTE: This medicine is only for you. Do not share this medicine with others. What if I miss a dose? It is important not to miss your dose. Call your doctor or health care professional if you are unable to keep an appointment. What may interact with this medicine?  medicines that suppress your immune function like adalimumab, anakinra, infliximab  medicines to treat cancer  steroid medicines like prednisone or cortisone This list may not describe all possible interactions. Give your health care provider a list of all the medicines, herbs, non-prescription drugs, or dietary supplements you use. Also tell them if you smoke, drink alcohol, or use illegal drugs. Some items may interact with your medicine. What should I watch for while using this medicine? See your health care provider for all shots of this vaccine as directed. You must have 3 shots of this vaccine for protection from hepatitis B infection. Tell your doctor right away if you have any serious or unusual side effects after getting  this vaccine. What side effects may I notice from receiving this medicine? Side effects that you should report to your doctor or health care professional as soon as possible:  allergic reactions like skin rash, itching or hives, swelling of the face, lips, or tongue  breathing problems  confused, irritated  fast, irregular heartbeat  flu-like syndrome  numb,  tingling pain  seizures  unusually weak or tired Side effects that usually do not require medical attention (report to your doctor or health care professional if they continue or are bothersome):  diarrhea  fever  headache  loss of appetite  muscle pain  nausea  pain, redness, swelling, or irritation at site where injected  tiredness This list may not describe all possible side effects. Call your doctor for medical advice about side effects. You may report side effects to FDA at 1-800-FDA-1088. Where should I keep my medicine? This drug is given in a hospital or clinic and will not be stored at home. NOTE: This sheet is a summary. It may not cover all possible information. If you have questions about this medicine, talk to your doctor, pharmacist, or health care provider.  2020 Elsevier/Gold Standard (2014-02-21 13:26:01)

## 2020-08-24 ENCOUNTER — Encounter: Payer: Self-pay | Admitting: Family Medicine

## 2020-08-24 ENCOUNTER — Ambulatory Visit (INDEPENDENT_AMBULATORY_CARE_PROVIDER_SITE_OTHER): Payer: 59 | Admitting: Family Medicine

## 2020-08-24 ENCOUNTER — Ambulatory Visit: Payer: Self-pay

## 2020-08-24 VITALS — Temp 97.1°F | Ht 68.0 in | Wt 140.0 lb

## 2020-08-24 DIAGNOSIS — J069 Acute upper respiratory infection, unspecified: Secondary | ICD-10-CM

## 2020-08-24 NOTE — Progress Notes (Signed)
Name: Teresa Dodson   MRN: [id number]    DOB: July 25, 1961   Date:08/24/2020       Progress Note  Subjective:    Chief Complaint  Chief Complaint  Patient presents with  . Sinusitis  . Cough    I connected with  Teresa Dodson on 08/24/20 at  1:20 PM EDT by telephone and verified that I am speaking with the correct person using two identifiers.   I discussed the limitations, risks, security and privacy concerns of performing an evaluation and management service by telephone and the availability of in person appointments. Staff also discussed with the patient that there may be a patient responsible charge related to this service.  Patient verbalized understanding and agreed to proceed with encounter. Patient Location:   home Provider Location: [facility] Additional Individuals present: none  Sinusitis This is a new problem. The current episode started yesterday. The problem has been gradually improving since onset. There has been no fever. Associated symptoms include congestion, coughing (mild, brief tickle cough, resolved), a hoarse voice, sinus pressure and a sore throat (mild, improved). Pertinent negatives include no chills, diaphoresis, ear pain, headaches, neck pain, shortness of breath, sneezing or swollen glands. Treatments tried: tried dayquil and nyquil. The treatment provided mild relief.  she hears a wheezy sound with her breathing but does not feel wheezy, SOB, no CP, chest tightness.  No facial pain, HA, myalgias, sweats, H/C chills, or GI sx     Patient Active Problem List   Diagnosis Date Noted  . 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 Family history of Alzheimer's disease 02/24/2020  . Hyperlipidemia 02/24/2020  . Impingement syndrome of right shoulder region 09/22/2019  . History of bilateral saline breast implants 01/03/2016  . 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 History of nephrolithiasis 06/22/2015  . Hypertension, goal below 140/90 06/22/2015  .  Allergic rhinitis with postnasal drip 06/22/2015  . Foot pain, bilateral 06/22/2015  . History of Bell's palsy 09/14/2014  . 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 Smoking status: Never Smoker  . Smokeless tobacco: Never Used  Substance Use Topics  . Alcohol use: No    Alcohol/week: 0.0 standard drinks     Current Outpatient Medications:  .  amLODipine-benazepril (LOTREL) 5-10 MG capsule, Take 1 capsule by mouth daily., Disp: 90 capsule, Rfl: 3 .  cholecalciferol (VITAMIN D) 1000 units tablet, Take 1,000 Units by mouth daily., Disp: , Rfl:  .  Multiple Vitamins-Minerals (HAIR SKIN AND NAILS FORMULA PO), Take by mouth., Disp: , Rfl:  .  Multiple Vitamins-Minerals (MULTIPLE VITAMINS/WOMENS PO), Take by mouth., Disp: , Rfl:  .  conjugated estrogens (PREMARIN) vaginal cream, Place 1 applicatorful vaginally daily x 1 week, then do twice weekly thereafter for atrophic vaginitis and dysparenia (Patient not taking: Reported on 08/24/2020), Disp: 42.5 g, Rfl: 5 .  donepezil (ARICEPT) 5 MG tablet, Take by mouth., Disp: , Rfl:   Allergies  Allergen Reactions  . Sulfamethoxazole-Trimethoprim Rash    Chart Review: I personally reviewed active problem list, medication list, allergies, family history, social history, health maintenance, notes from last encounter, lab results, imaging with the patient/caregiver today.   Review of Systems  Constitutional: Negative.  Negative for chills and diaphoresis.  HENT: Positive for congestion, hoarse voice, sinus pressure and sore throat (mild, improved). Negative for ear pain and sneezing.   Eyes: Negative.   Respiratory: Positive for cough (mild, brief tickle cough, resolved). Negative for shortness  of breath.   Cardiovascular: Negative.   Gastrointestinal: Negative.   Endocrine: Negative.   Genitourinary: Negative.   Musculoskeletal: Negative.  Negative for neck pain.  Skin: Negative.   Allergic/Immunologic:  Negative.   Neurological: Negative.  Negative for headaches.  Hematological: Negative.   Psychiatric/Behavioral: Negative.   All other systems reviewed and are negative.    Objective:    Virtual encounter, vitals limited, only able to obtain the following Today's Vitals   08/24/20 1214  Temp: (!) 97.1 F (36.2 C)  TempSrc: Temporal  Weight: 140 lb (63.5 kg)  Height: 5\' 8"  (1.727 m)  PainSc: 0-No pain   Body mass index is 21.29 kg/m. Nursing Note and Vital Signs reviewed.  Physical Exam Vitals and nursing note reviewed.  Pulmonary:     Comments: No audible wheeze, stridor, or tachypnea, able to speak in full and complete sentences Neurological:     Mental Status: She is alert.  Psychiatric:        Mood and Affect: Mood normal.     PE limited by telephone encounter  No results found for this or any previous visit (from the past 72 hour(s)).  Assessment and Plan:     ICD-10-CM   1. Upper respiratory tract infection, unspecified type  J06.9   some unilateral nasal congestion and cough and sore throat that improved, sx started yesterday, already improving, she is vaccinated for COVID. Encouraged COVID testing per CDC recommendations Encouraged her to try antihistamines and steroid nasal sprays She can continue other OTC meds that have been effective No concerning sx or signs right now Explained with less than 24 hours of sx, it may be a viral URI, may be allergies Tx supportive and symptomatic, f/up if not improving in ~2 weeks or if any worsening such as CP, SOB severe facial pain with fever etc.  -Red flags and when to present for emergency care or RTC including but not limited to new/worsening/un-resolving symptoms,  reviewed with patient at time of visit. Follow up and care instructions discussed and provided in AVS. - I discussed the assessment and treatment plan with the patient. The patient was provided an opportunity to ask questions and all were answered. The  patient agreed with the plan and demonstrated an understanding of the instructions.  - The patient was advised to call back or seek an in-person evaluation if the symptoms worsen or if the condition fails to improve as anticipated.  I provided 20 minutes of non-face-to-face time during this encounter.  Delsa Grana, PA-C 08/24/20 1:37 PM

## 2020-08-24 NOTE — Telephone Encounter (Signed)
Pt. Started having dry cough and sinus symptoms Saturday. No fever. Taking Dayquil and Nyquil. "I hear a rattling in my chest." No shortness of breath. Virtual visit made.  Reason for Disposition . [1] Nasal discharge AND [2] present > 10 days  Answer Assessment - Initial Assessment Questions 1. ONSET: "When did the cough begin?"      Saturday 2. SEVERITY: "How bad is the cough today?"      Mild 3. SPUTUM: "Describe the color of your sputum" (none, dry cough; clear, white, yellow, green)     No 4. HEMOPTYSIS: "Are you coughing up any blood?" If so ask: "How much?" (flecks, streaks, tablespoons, etc.)     No 5. DIFFICULTY BREATHING: "Are you having difficulty breathing?" If Yes, ask: "How bad is it?" (e.g., mild, moderate, severe)    - MILD: No SOB at rest, mild SOB with walking, speaks normally in sentences, can lay down, no retractions, pulse < 100.    - MODERATE: SOB at rest, SOB with minimal exertion and prefers to sit, cannot lie down flat, speaks in phrases, mild retractions, audible wheezing, pulse 100-120.    - SEVERE: Very SOB at rest, speaks in single words, struggling to breathe, sitting hunched forward, retractions, pulse > 120      No 6. FEVER: "Do you have a fever?" If Yes, ask: "What is your temperature, how was it measured, and when did it start?"     No 7. CARDIAC HISTORY: "Do you have any history of heart disease?" (e.g., heart attack, congestive heart failure)      No 8. LUNG HISTORY: "Do you have any history of lung disease?"  (e.g., pulmonary embolus, asthma, emphysema)     No 9. PE RISK FACTORS: "Do you have a history of blood clots?" (or: recent major surgery, recent prolonged travel, bedridden)     No 10. OTHER SYMPTOMS: "Do you have any other symptoms?" (e.g., runny nose, wheezing, chest pain)       Rattling 11. PREGNANCY: "Is there any chance you are pregnant?" "When was your last menstrual period?"       No 12. TRAVEL: "Have you traveled out of the country in  the last month?" (e.g., travel history, exposures)       No  Protocols used: COUGH - ACUTE NON-PRODUCTIVE-A-AH

## 2020-08-27 ENCOUNTER — Ambulatory Visit
Admission: EM | Admit: 2020-08-27 | Discharge: 2020-08-27 | Disposition: A | Discharge status: Home / Self Care | Payer: 59 | Attending: Family Medicine | Admitting: Family Medicine

## 2020-08-27 ENCOUNTER — Other Ambulatory Visit: Payer: Self-pay

## 2020-08-27 DIAGNOSIS — B9789 Other viral agents as the cause of diseases classified elsewhere: Secondary | ICD-10-CM | POA: Diagnosis not present

## 2020-08-27 DIAGNOSIS — J988 Other specified respiratory disorders: Secondary | ICD-10-CM

## 2020-08-27 MED ORDER — IPRATROPIUM BROMIDE 0.06 % NA SOLN: 2.0000 | Freq: Four times a day (QID) | NASAL | 0 refills | Status: DC | PRN

## 2020-08-27 MED ORDER — ALBUTEROL SULFATE HFA 108 (90 BASE) MCG/ACT IN AERS: 1.0000 | INHALATION_SPRAY | Freq: Four times a day (QID) | RESPIRATORY_TRACT | 0 refills | Status: DC | PRN

## 2020-08-27 NOTE — Discharge Instructions (Signed)
Rest.   Fluids.  Medication as directed.  Take care  Dr. Lacinda Axon

## 2020-08-27 NOTE — ED Triage Notes (Addendum)
Per pt report she has been wheezing for past two days. No cough, no runny nose, no fever. Sleeping more than usual. Negative rapid COVID test yesterday

## 2020-08-29 NOTE — ED Provider Notes (Signed)
[facility]    CSN: [id number] Arrival date & time: 08/27/20  1501      History   Chief Complaint Chief Complaint  Patient presents with  . Wheezing   HPI  59 year old female presents with respiratory symptoms.  Patient has had symptoms since 10/20.  Was seen by her primary care physician on 10/21.  Diagnosed with a viral respiratory infection.  Patient has had some congestion, coughing, wheezing, hoarseness.  She has had some mild sore throat.  No fever.  She has tried some over-the-counter treatment without resolution.  Her primary complaint at this time is a wheezing.  She has had a negative Covid test as of yesterday.  No other associated symptoms.  No other complaints.  Past Medical History:  Diagnosis Date  . Allergic rhinitis with postnasal drip   . Cystadenofibroma of left ovary   . History of nephrolithiasis   . HTN, goal below 140/90   . Kidney stone   . Left-sided Bell's palsy     Patient Active Problem List   Diagnosis Date Noted  . 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 Family history of Alzheimer's disease 02/24/2020  . Hyperlipidemia 02/24/2020  . Impingement syndrome of right shoulder region 09/22/2019  . History of bilateral saline breast implants 01/03/2016  . 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 History of nephrolithiasis 06/22/2015  . Hypertension, goal below 140/90 06/22/2015  . Allergic rhinitis with postnasal drip 06/22/2015  . Foot pain, bilateral 06/22/2015  . History of Bell's palsy 09/14/2014  . 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 ABDOMINAL HYSTERECTOMY     vaginal; partial    OB History    Gravida  2   Para  2   Term  2   Preterm      AB      Living  2     SAB      TAB      Ectopic      Multiple      Live Births               Home Medications    Prior to Admission medications   Medication Sig Start Date End Date Taking? Authorizing  Provider  albuterol (VENTOLIN HFA) 108 (90 Base) MCG/ACT inhaler Inhale 1-2 puffs into the lungs every 6 (six) hours as needed for wheezing or shortness of breath. 08/27/20   Coral Spikes, DO  amLODipine-benazepril (LOTREL) 5-10 MG capsule Take 1 capsule by mouth daily. 02/29/20   Delsa Grana, PA-C  cholecalciferol (VITAMIN D) 1000 units tablet Take 1,000 Units by mouth daily.    [provider]  conjugated estrogens (PREMARIN) vaginal cream Place 1 applicatorful vaginally daily x 1 week, then do twice weekly thereafter for atrophic vaginitis and dysparenia Patient not taking: Reported on 08/24/2020 05/25/20   Delsa Grana, PA-C  donepezil (ARICEPT) 5 MG tablet Take by mouth. 04/26/20 05/26/20  [provider]  ipratropium (ATROVENT) 0.06 % nasal spray Place 2 sprays into both nostrils 4 (four) times daily as needed for rhinitis. 08/27/20   Coral Spikes, DO  Multiple Vitamins-Minerals (HAIR SKIN AND NAILS FORMULA PO) Take by mouth.    [provider]  Multiple Vitamins-Minerals (MULTIPLE VITAMINS/WOMENS PO) Take by mouth.    [provider]    Family History Family History  Problem Relation Age of Onset  . Alzheimer's disease Mother   . Cancer Father  lung  . Diabetes Paternal Grandmother     Social History Social History   Tobacco Use  . Smoking status: Never Smoker  . Smokeless tobacco: Never Used  Vaping Use  . Vaping Use: Never used  Substance Use Topics  . Alcohol use: No    Alcohol/week: 0.0 standard drinks  . Drug use: No     Allergies   Sulfamethoxazole-trimethoprim   Review of Systems Review of Systems Per HPI  Physical Exam Triage Vital Signs ED Triage Vitals  Enc Vitals Group     BP 08/27/20 1513 118/84     Pulse Rate 08/27/20 1513 64     Resp 08/27/20 1513 19     Temp 08/27/20 1513 98.1 F (36.7 C)     Temp Source 08/27/20 1513 Oral     SpO2 08/27/20 1513 100 %     Weight 08/27/20 1509 130 lb (59 kg)      Height 08/27/20 1509 5' 7.5" (1.715 m)     Head Circumference --      Peak Flow --      Pain Score 08/27/20 1531 0     Pain Loc --      Pain Edu? --      Excl. in [place]? --    Updated Vital Signs BP 118/84 (BP Location: Left Arm)   Pulse 64   Temp 98.1 F (36.7 C) (Oral)   Resp 19   Ht 5' 7.5" (1.715 m)   Wt 59 kg   SpO2 100%   BMI 20.06 kg/m   Visual Acuity Right Eye Distance:   Left Eye Distance:   Bilateral Distance:    Right Eye Near:   Left Eye Near:    Bilateral Near:     Physical Exam Vitals and nursing note reviewed.  Constitutional:      General: She is not in acute distress.    Appearance: Normal appearance. She is not ill-appearing.  HENT:     Head: Normocephalic and atraumatic.     Right Ear: Tympanic membrane normal.     Left Ear: Tympanic membrane normal.  Cardiovascular:     Rate and Rhythm: Normal rate and regular rhythm.     Heart sounds: No murmur heard.   Pulmonary:     Effort: Pulmonary effort is normal.     Breath sounds: Normal breath sounds. No wheezing, rhonchi or rales.  Neurological:     Mental Status: She is alert.  Psychiatric:        Mood and Affect: Mood normal.        Behavior: Behavior normal.    UC Treatments / Results  Labs (all labs ordered are listed, but only abnormal results are displayed) Labs Reviewed - No data to display  EKG   Radiology No results found.  Procedures Procedures (including critical care time)  Medications Ordered in UC Medications - No data to display  Initial Impression / Assessment and Plan / UC Course  I have reviewed the triage vital signs and the nursing notes.  Pertinent labs & imaging results that were available during my care of the patient were reviewed by me and considered in my medical decision making (see chart for details).    59 year old female presents with a viral respiratory infection.  Treating with Atrovent nasal spray and albuterol.  Final Clinical Impressions(s) / UC  Diagnoses   Final diagnoses:  Viral respiratory infection     Discharge Instructions     Rest.   Fluids.  Medication as directed.  Take care  Dr. Lacinda Axon    ED Prescriptions    Medication Sig Dispense Auth. Provider   ipratropium (ATROVENT) 0.06 % nasal spray Place 2 sprays into both nostrils 4 (four) times daily as needed for rhinitis. 15 mL Lyrical Sowle G, DO   albuterol (VENTOLIN HFA) 108 (90 Base) MCG/ACT inhaler Inhale 1-2 puffs into the lungs every 6 (six) hours as needed for wheezing or shortness of breath. 18 g Coral Spikes, DO     PDMP not reviewed this encounter.   Thersa Salt [place], [place] 08/29/20 [phone]

## 2020-08-30 ENCOUNTER — Telehealth: Payer: Self-pay

## 2020-08-30 NOTE — Telephone Encounter (Signed)
Copied from [place] [id number]. Topic: General - Inquiry >> Aug 30, 2020  1:19 PM Gillis Ends D wrote: Reason for CRM: Patient wants to know if its okay to take Neuriva with all of her current medications. She can be reached at [phone]. Please advise

## 2020-08-31 NOTE — Telephone Encounter (Signed)
Please advise 

## 2020-09-04 NOTE — Telephone Encounter (Signed)
Neuriva is for the brain. This is a otc medication at [facility].

## 2020-09-04 NOTE — Telephone Encounter (Signed)
Looked this medication up and it is a all natural otc medication from [place] or [employer]

## 2020-09-05 NOTE — Telephone Encounter (Signed)
Patient notified

## 2020-11-25 ENCOUNTER — Other Ambulatory Visit: Payer: Self-pay

## 2020-11-25 ENCOUNTER — Ambulatory Visit
Admission: EM | Admit: 2020-11-25 | Discharge: 2020-11-25 | Disposition: A | Discharge status: Home / Self Care | Payer: 59 | Attending: Physician Assistant | Admitting: Physician Assistant

## 2020-11-25 ENCOUNTER — Encounter: Payer: Self-pay | Admitting: Emergency Medicine

## 2020-11-25 ENCOUNTER — Ambulatory Visit
Admission: EM | Admit: 2020-11-25 | Discharge: 2020-11-25 | Disposition: A | Discharge status: Other Acute Inpatient Hospital | Payer: 59

## 2020-11-25 DIAGNOSIS — Z20822 Contact with and (suspected) exposure to covid-19: Secondary | ICD-10-CM | POA: Diagnosis not present

## 2020-11-25 DIAGNOSIS — Z0189 Encounter for other specified special examinations: Secondary | ICD-10-CM | POA: Diagnosis not present

## 2020-11-25 NOTE — Discharge Instructions (Signed)

## 2020-11-25 NOTE — ED Triage Notes (Signed)
Patient here for a covid test due to travel.  Patient denies any covid symptoms.

## 2020-11-27 LAB — SARS CORONAVIRUS 2 (TAT 6-24 HRS): SARS Coronavirus 2: NEGATIVE

## 2020-12-06 ENCOUNTER — Telehealth: Payer: Self-pay | Admitting: Family Medicine

## 2020-12-06 NOTE — Telephone Encounter (Signed)
Patient would like to be contacted to discuss dietary supplements and their interaction with amLODipine-benazepril (LOTREL) 5-10 MG  Patient declined to make an appointment at the time of call with agent

## 2020-12-07 NOTE — Telephone Encounter (Signed)
Medication name is Neuroq

## 2020-12-07 NOTE — Telephone Encounter (Signed)
Cannot give patient any information on supplements. She can bring in with her at her next appointment and discuss with doctor

## 2021-01-03 NOTE — Progress Notes (Signed)
Name: Teresa Dodson   MRN: [id number]    DOB: 03/23/1961   Date:01/05/2021       Progress Note  Subjective  Chief Complaint  Acute visit for vaginal discomfort  HPI  Vulva irritation: she shaved her vulva 8 days ago, she usually gets waxed. She states three days ago she noticed a spot that is red and burns when she voids. Same sexual partner for 40 years. No vaginal discharge, fever , chills. 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    Past Surgical History:  Procedure Laterality Date   ABDOMINAL HYSTERECTOMY     vaginal; partial    Family History  Problem Relation Age of Onset   Alzheimer's disease Mother    Cancer Father        lung   Diabetes Paternal Grandmother     Social History   Tobacco Use   Smoking status: Never Smoker   Smokeless tobacco: Never Used  Substance Use Topics   Alcohol use: No    Alcohol/week: 0.0 standard drinks     Current Outpatient Medications:    amLODipine-benazepril (LOTREL) 5-10 MG capsule, Take 1 capsule by mouth daily., Disp: 90 capsule, Rfl: 3   cholecalciferol (VITAMIN D) 1000 units tablet, Take 1,000 Units by mouth daily., Disp: , Rfl:    Multiple Vitamins-Minerals (MULTIPLE VITAMINS/WOMENS PO), Take by mouth., Disp: , Rfl:    albuterol (VENTOLIN HFA) 108 (90 Base) MCG/ACT inhaler, Inhale 1-2  puffs into the lungs every 6 (six) hours as needed for wheezing or shortness of breath. (Patient not taking: Reported on 01/05/2021), Disp: 18 g, Rfl: 0   conjugated estrogens (PREMARIN) vaginal cream, Place 1 applicatorful vaginally daily x 1 week, then do twice weekly thereafter for atrophic vaginitis and dysparenia (Patient not taking: Reported on 01/05/2021), Disp: 42.5 g, Rfl: 5   donepezil (ARICEPT) 5 MG tablet, Take by mouth., Disp: , Rfl:    ipratropium (ATROVENT) 0.06 % nasal spray, Place 2 sprays into both nostrils 4 (four) times daily as needed for rhinitis. (Patient not taking: Reported on 01/05/2021), Disp: 15 mL, Rfl: 0   Multiple Vitamins-Minerals (HAIR SKIN AND NAILS FORMULA PO), Take by mouth. (Patient not taking: Reported on 01/05/2021), Disp: , Rfl:   Allergies  Allergen Reactions   Sulfamethoxazole-Trimethoprim Rash    I personally reviewed active problem list, medication list, allergies, family history, social history, health maintenance with the patient/caregiver today.   ROS  Ten systems reviewed and is negative except as mentioned in HPI   Objective  Vitals:   01/05/21 1036  BP: 122/80  Pulse: 90  Resp: 16  Temp: 98.1 F (36.7 C)  TempSrc: Oral  SpO2: 99%  Weight: 141 lb 1.6 oz (64 kg)  Height: 5\' 8"  (1.727 m)    Body mass index is 21.45 kg/m.  Physical Exam  Constitutional: Patient appears well-developed and well-nourished. No distress.  HEENT: head atraumatic,  normocephalic, pupils equal and reactive to light,  neck supple Cardiovascular: Normal rate, regular rhythm and normal heart sounds.  No murmur heard. No BLE edema. Pulmonary/Chest: Effort normal and breath sounds normal. No respiratory distress. Abdominal: Soft.  There is no tenderness. GYN: right labia majora has an area of ulceration with erythematous base, does not seem to be tender to touch.  Psychiatric: Patient has a normal mood and affect. behavior is normal. Judgment and thought  content normal.  Recent Results (from the past 2160 hour(s))  SARS CORONAVIRUS 2 (TAT 6-24 HRS) Nasopharyngeal Nasopharyngeal Swab     Status: None   Collection Time: 11/25/20  2:25 PM   Specimen: Nasopharyngeal Swab  Result Value Ref Range   SARS Coronavirus 2 NEGATIVE NEGATIVE    Comment: (NOTE) SARS-CoV-2 target nucleic acids are NOT DETECTED.  The SARS-CoV-2 RNA is generally detectable in upper and lower respiratory specimens during the acute phase of infection. Negative results do not preclude SARS-CoV-2 infection, do not rule out co-infections with other pathogens, and should not be used as the sole basis for treatment or other patient management decisions. Negative results must be combined with clinical observations, patient history, and epidemiological information. The expected result is Negative.  Fact Sheet for Patients: SugarRoll.be  Fact Sheet for Healthcare Providers: https://www.woods-mathews.com/  This test is not yet approved or cleared by the [place] FDA and  has been authorized for detection and/or diagnosis of SARS-CoV-2 by FDA under an Emergency Use Authorization (EUA). This EUA will remain  in effect (meaning this test can be used) for the duration of the COVID-19 declaration under Se ction 564(b)(1) of the Act, 21 U.S.C. section 360bbb-3(b)(1), unless the authorization is terminated or revoked sooner.  Performed at [facility], [place] [address], [place], [place] [address]     PHQ2/9: Depression screen [facility] 2/9 01/05/2021 08/24/2020 05/25/2020 02/24/2020 09/21/2019  Decreased Interest 0 0 0 0 1  Down, Depressed, Hopeless 0 0 0 0 1  PHQ - 2 Score 0 0 0 0 2  Altered sleeping - - 0 0 0  Tired, decreased energy - - 0 0 0  Change in appetite - - 0 0 0  Feeling bad or failure about yourself  - - 0 0 0  Trouble concentrating - - 0 0 0  Moving slowly or fidgety/restless - - 0 0 0  Suicidal thoughts - - 0 0 0   PHQ-9 Score - - 0 0 2  Difficult doing work/chores - - Not difficult at all Not difficult at all Not difficult at all  Some recent data might be hidden    phq 9 is negative   Fall Risk: Fall Risk  01/05/2021 08/24/2020 05/25/2020 02/24/2020 09/21/2019  Falls in the past year? 0 0 0 1 1  Number falls in past yr: 0 0 0 0 0  Injury with Fall? - 0 0 1 1  Risk for fall due to : - - - History of fall(s) History of fall(s)  Follow up Falls prevention discussed - Falls evaluation completed Education provided;Falls prevention discussed;Falls evaluation completed Falls evaluation completed     Functional Status Survey: Is the patient deaf or have difficulty hearing?: No Does the patient have difficulty seeing, even when wearing glasses/contacts?: No Does the patient have difficulty concentrating, remembering, or making decisions?: No Does the patient have difficulty walking or climbing stairs?: No Does the patient have difficulty dressing or bathing?: No Does the patient have difficulty doing errands alone such  as visiting a doctor's office or shopping?: No    Assessment & Plan  1. Vulvar lesion  - Herpes simplex virus culture - valACYclovir (VALTREX) 1000 MG tablet; Take 1 tablet (1,000 mg total) by mouth 2 (two) times daily.  Dispense: 20 tablet; Refill: 0

## 2021-01-05 ENCOUNTER — Ambulatory Visit: Payer: 59 | Admitting: Family Medicine

## 2021-01-05 ENCOUNTER — Other Ambulatory Visit: Payer: Self-pay

## 2021-01-05 ENCOUNTER — Encounter: Payer: Self-pay | Admitting: Family Medicine

## 2021-01-05 VITALS — BP 122/80 | HR 90 | Temp 98.1°F | Resp 16 | Ht 68.0 in | Wt 141.1 lb

## 2021-01-05 DIAGNOSIS — N9089 Other specified noninflammatory disorders of vulva and perineum: Secondary | ICD-10-CM

## 2021-01-05 MED ORDER — VALACYCLOVIR HCL 1 G PO TABS: 1000.0000 mg | ORAL_TABLET | Freq: Two times a day (BID) | ORAL | 0 refills | Status: DC

## 2021-01-09 LAB — HERPES SIMPLEX VIRUS CULTURE
MICRO NUMBER:: 11610586
SPECIMEN QUALITY:: ADEQUATE

## 2021-05-21 ENCOUNTER — Other Ambulatory Visit: Payer: Self-pay | Admitting: Family Medicine

## 2021-05-21 ENCOUNTER — Telehealth: Payer: Self-pay | Admitting: Family Medicine

## 2021-05-21 MED ORDER — AMLODIPINE BESY-BENAZEPRIL HCL 5-10 MG PO CAPS: 1.0000 | ORAL_CAPSULE | Freq: Every day | ORAL | 0 refills | Status: DC

## 2021-05-21 NOTE — Telephone Encounter (Signed)
Medication Refill - Medication: Amlodipine  Has the patient contacted their pharmacy? Yes. She states that the they could not refill the medication. Pt states that she is completely out of this medication. Please advise.  (Agent: If no, request that the patient contact the pharmacy for the refill.) (Agent: If yes, when and what did the pharmacy advise?)  Preferred Pharmacy (with phone number or street name):  [facility] [place], [place]  [place] [address]  Phone: [phone] Fax: [phone]  Hours: Not open 24 hours   Agent: Please be advised that RX refills may take up to 3 business days. We ask that you follow-up with your pharmacy.

## 2021-05-21 NOTE — Telephone Encounter (Signed)
Medication Refill - Medication:  amLODipine-benazepril (LOTREL) 5-10 MG capsule   Has the patient contacted their pharmacy? No.  Preferred Pharmacy (with phone number or street name):  [facility] [place], [place]  Phone:  [phone] Fax:  [phone]   Agent: Please be advised that RX refills may take up to 3 business days. We ask that you follow-up with your pharmacy.

## 2021-05-21 NOTE — Telephone Encounter (Signed)
  Notes to clinic:  script has expired  Review for continued use and refill Patient is out of medication    Requested Prescriptions  Pending Prescriptions Disp Refills   amLODipine-benazepril (LOTREL) 5-10 MG capsule 90 capsule 3    Sig: Take 1 capsule by mouth daily.      Cardiovascular: CCB + ACEI Combos Failed - 05/21/2021 10:50 AM      Failed - Cr in normal range and within 180 days    Creat  Date Value Ref Range Status  02/24/2020 0.95 0.50 - 1.05 mg/dL Final    Comment:    For patients >71 years of age, the reference limit for Creatinine is approximately 13% higher for people identified as African-American. .    Creatinine, Urine  Date Value Ref Range Status  05/12/2018 154 20 - 275 mg/dL Final          Failed - K in normal range and within 180 days    Potassium  Date Value Ref Range Status  02/24/2020 3.8 3.5 - 5.3 mmol/L Final          Passed - Patient is not pregnant      Passed - Last BP in normal range    BP Readings from Last 1 Encounters:  01/05/21 122/80          Passed - Valid encounter within last 6 months    Recent Outpatient Visits           4 months ago Vulvar lesion   [facility] Steele Sizer, MD   9 months ago Upper respiratory tract infection, unspecified type   [facility] Delsa Grana, PA-C   12 months ago Adult general medical exam   [facility] [place] Delsa Grana, PA-C   1 year ago No-show for appointment   [facility] Delsa Grana, PA-C   1 year ago Memory problem   [facility] Delsa Grana, [place]

## 2021-05-21 NOTE — Telephone Encounter (Signed)
Last seen 3.4.2022 no upcoming appt sch'd

## 2021-05-21 NOTE — Telephone Encounter (Signed)
Duplicate request  Sent to office for approval

## 2021-05-22 NOTE — Telephone Encounter (Signed)
Lvm to inform prescription has been sent to pharmacy and for her to return call to sch appt

## 2021-06-18 ENCOUNTER — Telehealth: Payer: Self-pay | Admitting: Family Medicine

## 2021-06-18 NOTE — Telephone Encounter (Signed)
Pt is scheduled for this week

## 2021-06-18 NOTE — Telephone Encounter (Signed)
Pt overdue for appt and labs, already send in courtesy refills - please see if she will schedule appt to get rechecked

## 2021-06-20 ENCOUNTER — Encounter: Payer: Self-pay | Admitting: Family Medicine

## 2021-06-20 ENCOUNTER — Other Ambulatory Visit: Payer: Self-pay

## 2021-06-20 ENCOUNTER — Ambulatory Visit (INDEPENDENT_AMBULATORY_CARE_PROVIDER_SITE_OTHER): Payer: 59 | Admitting: Family Medicine

## 2021-06-20 VITALS — BP 122/80 | HR 89 | Temp 98.3°F | Resp 16 | Ht 68.0 in | Wt 133.7 lb

## 2021-06-20 DIAGNOSIS — Z1211 Encounter for screening for malignant neoplasm of colon: Secondary | ICD-10-CM

## 2021-06-20 DIAGNOSIS — E785 Hyperlipidemia, unspecified: Secondary | ICD-10-CM

## 2021-06-20 DIAGNOSIS — I10 Essential (primary) hypertension: Secondary | ICD-10-CM

## 2021-06-20 DIAGNOSIS — Z1231 Encounter for screening mammogram for malignant neoplasm of breast: Secondary | ICD-10-CM | POA: Diagnosis not present

## 2021-06-20 DIAGNOSIS — Z5181 Encounter for therapeutic drug level monitoring: Secondary | ICD-10-CM

## 2021-06-20 MED ORDER — AMLODIPINE BESY-BENAZEPRIL HCL 5-10 MG PO CAPS: 1.0000 | ORAL_CAPSULE | Freq: Every day | ORAL | 3 refills | Status: DC

## 2021-06-20 NOTE — Patient Instructions (Signed)
Health Maintenance  Topic Date Due   Mammogram  11/04/2012   COVID-19 Vaccine (4 - Booster for Moderna series) 07/06/2021*   Flu Shot  08/06/2021*   Zoster (Shingles) Vaccine (1 of 2) 09/20/2021*   Pneumococcal Vaccination (1 - PCV) 06/20/2022*   Cologuard (Stool DNA test)  06/20/2022*   Tetanus Vaccine  03/04/2027   Hepatitis C Screening: USPSTF Recommendation to screen - Ages 18-61 yo.  Completed   HIV Screening  Completed   HPV Vaccine  Aged Out   Pap Smear  Discontinued  *Topic was postponed. The date shown is not the original due date.   [facility] at [facility] [place],  [place]  [address] Get Driving Directions Main: [phone]  Call to schedule mammogram

## 2021-06-20 NOTE — Progress Notes (Signed)
Name: Teresa Dodson   MRN: [id number]    DOB: Oct 28, 1961   Date:06/20/2021       Progress Note  Chief Complaint  Patient presents with   Hypertension     Subjective:   Teresa Dodson is a 60 y.o. female, presents to clinic for routine follow-up  Hypertension:  Currently managed on amlodipine-benazepril  Pt reports good med compliance and denies any SE.   Blood pressure today is well controlled. BP Readings from Last 6 Encounters:  06/20/21 122/80  01/05/21 122/80  11/25/20 139/77  08/27/20 118/84  05/25/20 128/82  02/24/20 128/80   Pt denies CP, SOB, exertional sx, LE edema, palpitation, Ha's, visual disturbances, lightheadedness, hypotension, syncope. Dietary efforts for BP?  Walking, no diet efforts  Lab Results  Component Value Date   CHOL 210 (H) 02/24/2020   HDL 73 02/24/2020   LDLCALC 116 (H) 02/24/2020   TRIG 100 02/24/2020   CHOLHDL 2.9 02/24/2020  Not on meds, no family hx of MI or stroke   The 10-year ASCVD risk score Mikey Bussing DC Jr., et al., 2013) is: 5.3%   Values used to calculate the score:     Age: 69 years     Sex: Female     Is Non-Hispanic African American: Yes     Diabetic: No     Tobacco smoker: No     Systolic Blood Pressure: 123XX123 mmHg     Is BP treated: Yes     HDL Cholesterol: 73 mg/dL     Total Cholesterol: 210 mg/dL    Mother with alzheimer's mother 13's she is worried about forgetfulness and possible genetic risk   Overdue for most screening/vaccinations Willing to do mammogram and CRC screening FIT     Current Outpatient Medications:    amLODipine-benazepril (LOTREL) 5-10 MG capsule, Take 1 capsule by mouth daily., Disp: 30 capsule, Rfl: 0   cholecalciferol (VITAMIN D) 1000 units tablet, Take 1,000 Units by mouth daily., Disp: , Rfl:    Multiple Vitamins-Minerals (MULTIPLE VITAMINS/WOMENS PO), Take by mouth., Disp: , Rfl:    valACYclovir (VALTREX) 1000 MG tablet, Take 1 tablet (1,000 mg total) by mouth 2 (two) times daily.,  Disp: 20 tablet, Rfl: 0   donepezil (ARICEPT) 5 MG tablet, Take by mouth., Disp: , Rfl:   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    Past Surgical History:  Procedure Laterality Date   ABDOMINAL HYSTERECTOMY     vaginal; partial    Family History  Problem Relation Age of Onset   Alzheimer's disease Mother    Cancer Father        lung   Diabetes Paternal Grandmother     Social History   Tobacco Use   Smoking status: Never   Smokeless tobacco: Never  Vaping Use   Vaping Use: Never used  Substance Use Topics   Alcohol use: No    Alcohol/week: 0.0 standard drinks   Drug use: No     Allergies  Allergen Reactions  Sulfamethoxazole-Trimethoprim Rash    Health Maintenance  Topic Date Due   MAMMOGRAM  11/04/2012   COVID-19 Vaccine (4 - Booster for Moderna series) 07/06/2021 (Originally 12/21/2020)   INFLUENZA VACCINE  08/06/2021 (Originally 06/04/2021)   Zoster Vaccines- Shingrix (1 of 2) 09/20/2021 (Originally 08/09/1979)   Pneumococcal Vaccine 48-72 Years old (1 - PCV) 06/20/2022 (Originally 08/08/1966)   Fecal DNA (Cologuard)  06/20/2022 (Originally 12/18/2020)   TETANUS/TDAP  03/04/2027   Hepatitis C Screening  Completed   HIV Screening  Completed   HPV VACCINES  Aged Out   PAP SMEAR-Modifier  Discontinued    Chart Review Today: I personally reviewed active problem list, medication list, allergies, family history, social history, health  maintenance, notes from last encounter, lab results, imaging with the patient/caregiver today.   Review of Systems  Constitutional: Negative.   HENT: Negative.    Eyes: Negative.   Respiratory: Negative.    Cardiovascular: Negative.   Gastrointestinal: Negative.   Endocrine: Negative.   Genitourinary: Negative.   Musculoskeletal: Negative.   Skin: Negative.   Allergic/Immunologic: Negative.   Neurological: Negative.   Hematological: Negative.   Psychiatric/Behavioral: Negative.    All other systems reviewed and are negative.   Objective:   Vitals:   06/20/21 1348  BP: 122/80  Pulse: 89  Resp: 16  Temp: 98.3 F (36.8 C)  SpO2: 98%  Weight: 133 lb 11.2 oz (60.6 kg)  Height: '5\' 8"'$  (1.727 m)    Body mass index is 20.33 kg/m.  Physical Exam Vitals and nursing note reviewed.  Constitutional:      General: She is not in acute distress.    Appearance: Normal appearance. She is well-developed. She is not ill-appearing, toxic-appearing or diaphoretic.     Interventions: Face mask in place.  HENT:     Head: Normocephalic and atraumatic.     Right Ear: External ear normal.     Left Ear: External ear normal.  Eyes:     General: Lids are normal. No scleral icterus.       Right eye: No discharge.        Left eye: No discharge.     Conjunctiva/sclera: Conjunctivae normal.     Comments: Left-sided Bell's palsy  Neck:     Trachea: Phonation normal. No tracheal deviation.  Cardiovascular:     Rate and Rhythm: Normal rate and regular rhythm.     Pulses: Normal pulses.          Radial pulses are 2+ on the right side and 2+ on the left side.       Posterior tibial pulses are 2+ on the right side and 2+ on the left side.     Heart sounds: Normal heart sounds. No murmur heard.   No friction rub. No gallop.  Pulmonary:     Effort: Pulmonary effort is normal. No respiratory distress.     Breath sounds: Normal breath sounds. No stridor. No wheezing, rhonchi or rales.  Chest:      Chest wall: No tenderness.  Abdominal:     General: Bowel sounds are normal. There is no distension.     Palpations: Abdomen is soft.  Musculoskeletal:     Right lower leg: No edema.     Left lower leg: No edema.  Skin:    General: Skin is warm and dry.     Coloration: Skin is not jaundiced or pale.     Findings: No rash.  Neurological:     Mental Status: She is  alert.     Motor: No abnormal muscle tone.     Gait: Gait normal.  Psychiatric:        Mood and Affect: Mood normal.        Speech: Speech normal.        Behavior: Behavior normal.        Assessment & Plan:   1. Hypertension, goal below 140/90 Blood pressure at goal today, has been stable and well-controlled on amlodipine benazepril, due for labs that have not been done for 1 year, prior labs reviewed  BP Readings from Last 3 Encounters:  06/20/21 122/80  01/05/21 122/80  11/25/20 139/77   - amLODipine-benazepril (LOTREL) 5-10 MG capsule; Take 1 capsule by mouth daily.  Dispense: 90 capsule; Refill: 3 - COMPLETE METABOLIC PANEL WITH GFR  2. Hyperlipidemia, unspecified hyperlipidemia type Hyperlipidemia, not on meds, not working on any diet efforts, reviewed ASCVD risk with patient and reviewed her family history, will recommend statin when ASCVD risk is greater than 7.5% Labs due, recalculate risk and update patient, advised to work on diet and lifestyle efforts - COMPLETE METABOLIC PANEL WITH GFR - Lipid panel  3. Encounter for screening mammogram for malignant neoplasm of breast Overdue for mammogram screening reviewed orders and gave patient contact information to schedule - MM 3D SCREEN BREAST BILATERAL; Future - MM 3D SCREEN BREAST W/IMPLANT BILATERAL; Future  4. Screening for colon cancer Overdue for CRC screening discussed options Discussed USPSTF and different ways to screen ( Cologuard, Colonoscopy versus hemoccult stools) American College of Gastroenterology recommends screen of AA at age 86  Pt  denies melena, hematochezia, change in BM pattern or caliber   - CBC with Differential/Platelet - Fecal Globin By Immunochemistry  5. Encounter for medication monitoring  - COMPLETE METABOLIC PANEL WITH GFR - Lipid panel - CBC with Differential/Platelet   Return in about 6 months (around 12/21/2021) for Annual Physical.   Delsa Grana, PA-C 06/20/21 1:57 PM

## 2021-06-21 LAB — LIPID PANEL
Cholesterol: 230 mg/dL — ABNORMAL HIGH (ref <200)
HDL: 83 mg/dL (ref >=50)
LDL Cholesterol (Calc): 120 mg/dL (calc) — ABNORMAL HIGH
Non-HDL Cholesterol (Calc): 147 mg/dL (calc) — ABNORMAL HIGH (ref <130)
Total CHOL/HDL Ratio: 2.8 (calc) (ref <5.0)
Triglycerides: 157 mg/dL — ABNORMAL HIGH (ref <150)

## 2021-06-21 LAB — CBC WITH DIFFERENTIAL/PLATELET
Absolute Monocytes: 507 cells/uL (ref 200–950)
Basophils Absolute: 30 cells/uL (ref 0–200)
Basophils Relative: 0.5 %
Eosinophils Absolute: 47 cells/uL (ref 15–500)
Eosinophils Relative: 0.8 %
HCT: 41.8 % (ref 35.0–45.0)
Hemoglobin: 13.6 g/dL (ref 11.7–15.5)
Lymphs Abs: 1499 cells/uL (ref 850–3900)
MCH: 29.5 pg (ref 27.0–33.0)
MCHC: 32.5 g/dL (ref 32.0–36.0)
MCV: 90.7 fL (ref 80.0–100.0)
MPV: 10 fL (ref 7.5–12.5)
Monocytes Relative: 8.6 %
Neutro Abs: 3817 cells/uL (ref 1500–7800)
Neutrophils Relative %: 64.7 %
Platelets: 246 10*3/uL (ref 140–400)
RBC: 4.61 10*6/uL (ref 3.80–5.10)
RDW: 13.1 % (ref 11.0–15.0)
Total Lymphocyte: 25.4 %
WBC: 5.9 10*3/uL (ref 3.8–10.8)

## 2021-06-21 LAB — COMPLETE METABOLIC PANEL WITH GFR
AG Ratio: 1.8 (calc) (ref 1.0–2.5)
ALT: 16 U/L (ref 6–29)
AST: 19 U/L (ref 10–35)
Albumin: 4.6 g/dL (ref 3.6–5.1)
Alkaline phosphatase (APISO): 46 U/L (ref 37–153)
BUN/Creatinine Ratio: 15 (calc) (ref 6–22)
BUN: 17 mg/dL (ref 7–25)
CO2: 29 mmol/L (ref 20–32)
Calcium: 9.3 mg/dL (ref 8.6–10.4)
Chloride: 105 mmol/L (ref 98–110)
Creat: 1.14 mg/dL — ABNORMAL HIGH (ref 0.50–1.05)
Globulin: 2.5 g/dL (calc) (ref 1.9–3.7)
Glucose, Bld: 81 mg/dL (ref 65–99)
Potassium: 4 mmol/L (ref 3.5–5.3)
Sodium: 142 mmol/L (ref 135–146)
Total Bilirubin: 0.5 mg/dL (ref 0.2–1.2)
Total Protein: 7.1 g/dL (ref 6.1–8.1)
eGFR: 55 mL/min/{1.73_m2} — ABNORMAL LOW (ref >=60)

## 2021-06-28 ENCOUNTER — Other Ambulatory Visit: Payer: Self-pay

## 2021-06-28 ENCOUNTER — Ambulatory Visit
Admission: RE | Admit: 2021-06-28 | Discharge: 2021-06-28 | Disposition: A | Discharge status: Home / Self Care | Payer: 59 | Source: Ambulatory Visit | Attending: Family Medicine | Admitting: Family Medicine

## 2021-06-28 DIAGNOSIS — Z1231 Encounter for screening mammogram for malignant neoplasm of breast: Secondary | ICD-10-CM | POA: Diagnosis present

## 2021-07-12 ENCOUNTER — Encounter: Payer: Self-pay | Admitting: Family Medicine

## 2021-07-19 IMAGING — MR MR HEAD W/O CM
11 series · 45 of 48 positions shown · non-contrast
Comparison: None.

CLINICAL DATA: Memory loss

EXAM:
MRI HEAD WITHOUT CONTRAST
TECHNIQUE: Multiplanar, multiecho pulse sequences of the brain and surrounding
structures were obtained without intravenous contrast.

[Series 17: ax dwi_tracew · axial · 3.0mm · 0.60mm/px · z∈[-118,+29]mm · 4 of 48 slices shown]
[im 1/48]
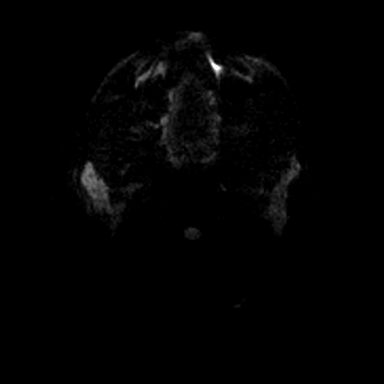
[im 16/48]
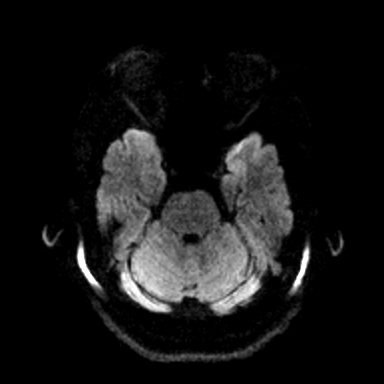
[im 32/48]
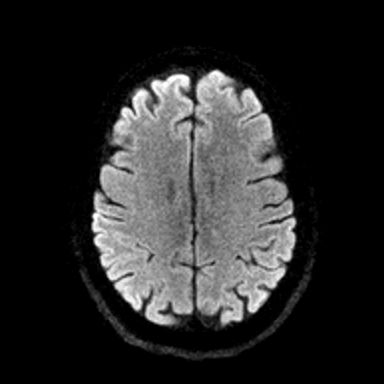
[im 48/48]
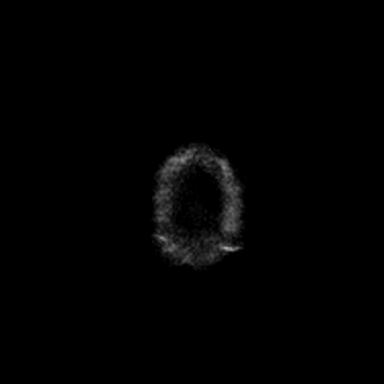

[Series 18: ax dwi_adc · axial · 3.0mm · 0.60mm/px · z∈[-118,+29]mm · 4 of 48 slices shown]
[im 1/48]
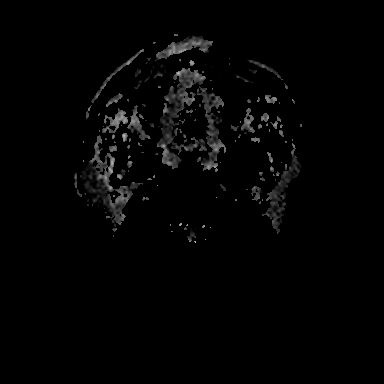
[im 16/48]
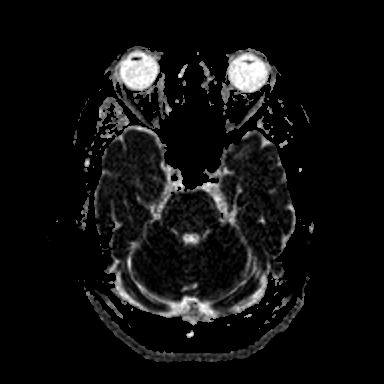
[im 32/48]
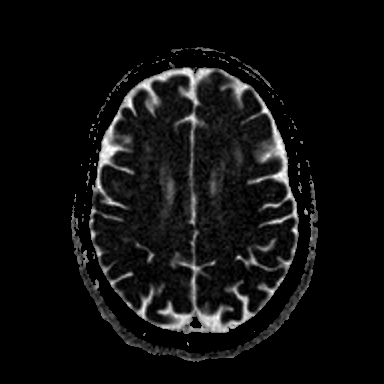
[im 48/48]
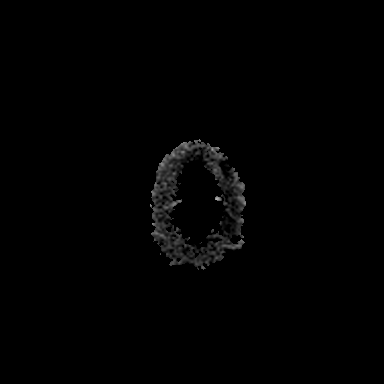

[Series 19: cor dwi_tracew · coronal · 5.0mm · 0.60mm/px · 3 of 40 slices shown]
[im 1/40]
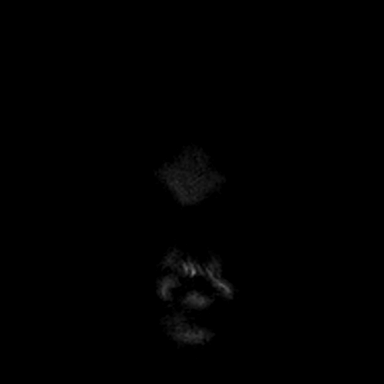
[im 20/40]
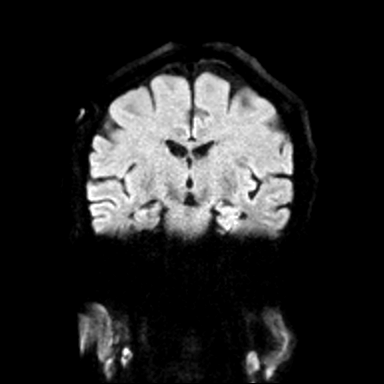
[im 40/40]
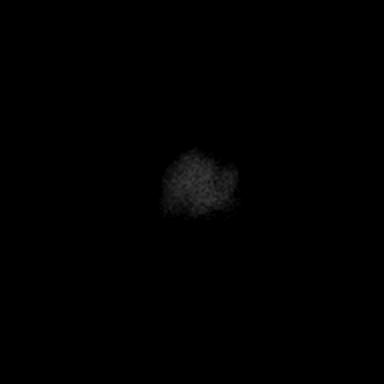

[Series 20: cor dwi_adc · coronal · 5.0mm · 0.60mm/px · 3 of 40 slices shown]
[im 1/40]
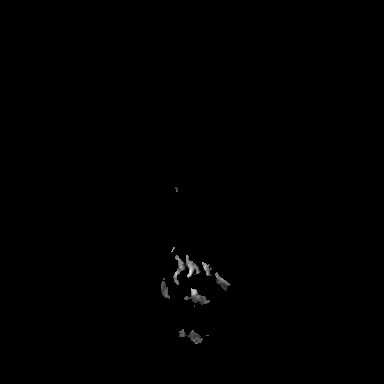
[im 20/40]
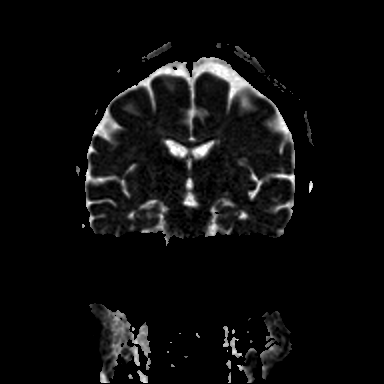
[im 40/40]
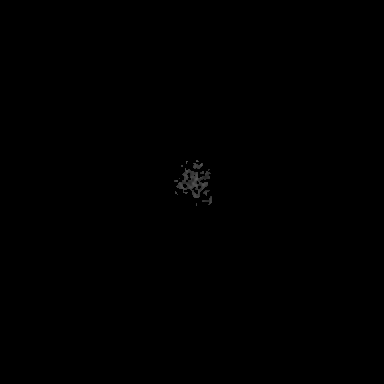

[Series 21: T1 · sagittal · 5.0mm · 0.62mm/px · 2 of 21 slices shown (1 of 2)]
[im 1/21]
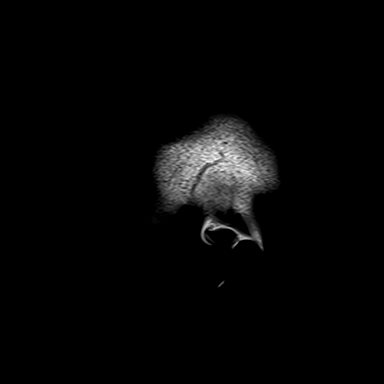
[im 21/21]
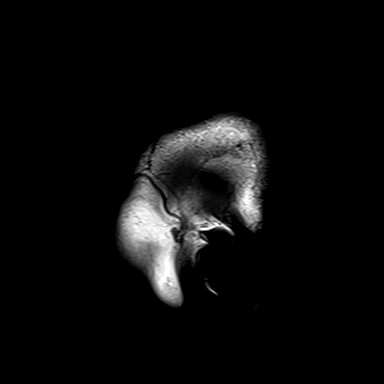

[Series 22: T2 · axial · 5.0mm · 0.53mm/px · z∈[-107,+30]mm · 2 of 25 slices shown (1 of 2)]
[im 1/25]
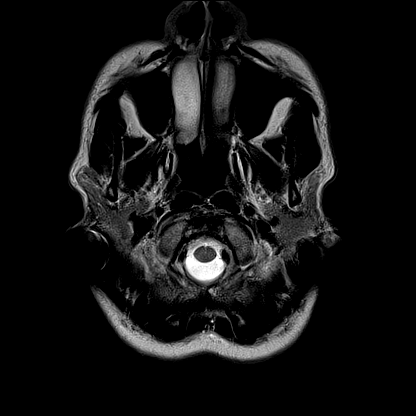
[im 25/25]
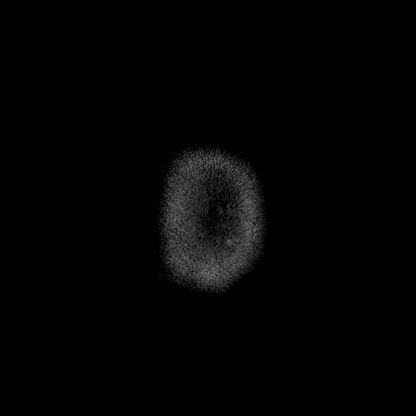

[Series 24: pha_images · axial · 3.0mm · 0.90mm/px · z∈[-125,+41]mm · 5 of 58 slices shown]
[im 1/58]
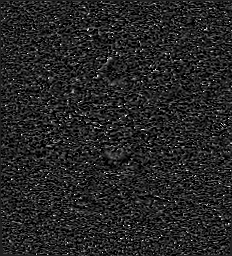
[im 15/58]
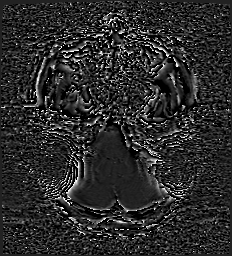
[im 29/58]
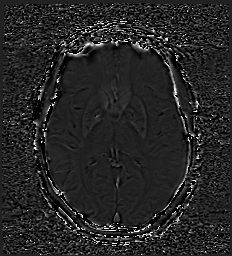
[im 43/58]
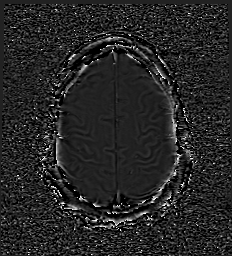
[im 58/58]
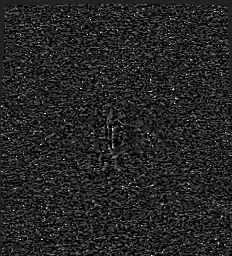

[Series 25: swi_images · axial · 3.0mm · 0.90mm/px · z∈[-125,+44]mm · 5 of 60 slices shown]
[im 1/60]
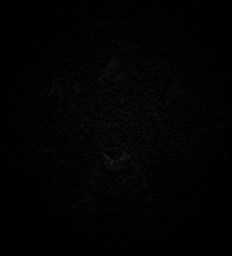
[im 15/60]
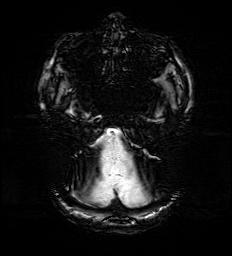
[im 30/60]
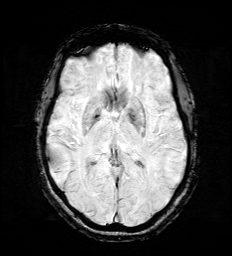
[im 45/60]
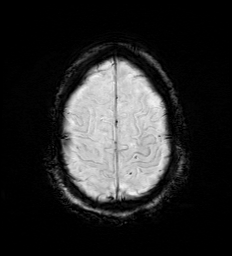
[im 60/60]
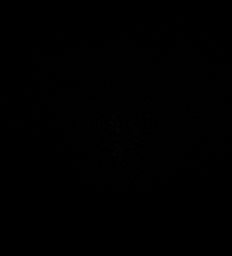

[Series 27: FLAIR · axial · 3.0mm · 0.53mm/px · z∈[-116,+38]mm · 4 of 55 slices shown]
[im 1/55]
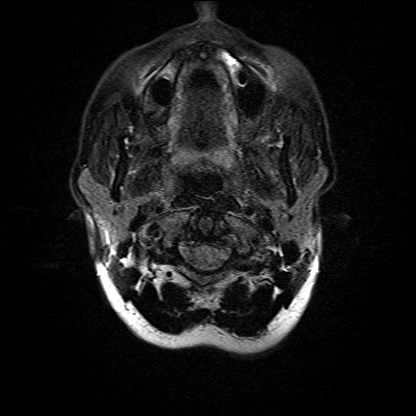
[im 19/55]
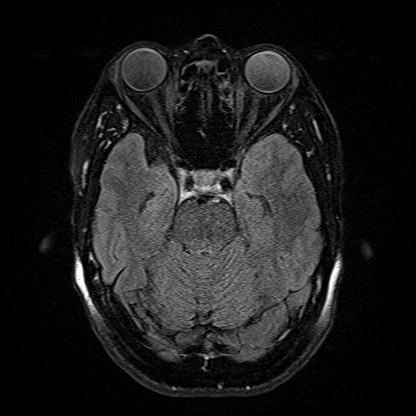
[im 37/55]
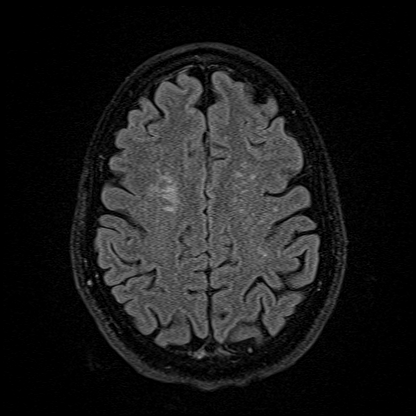
[im 55/55]
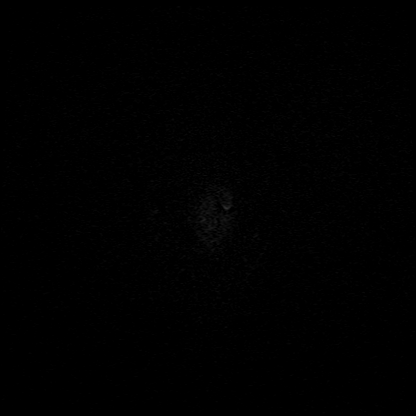

[Series 28: T1 · axial · 1.0mm · 0.98mm/px · z∈[-127,+40]mm · 11 of 176 slices shown (2 of 2)]
[im 1/176]
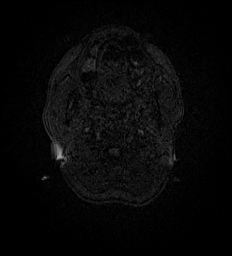
[im 14/176]
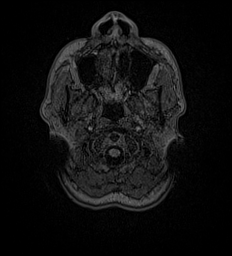
[im 27/176]
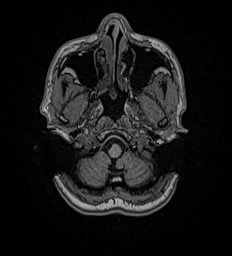
[im 41/176]
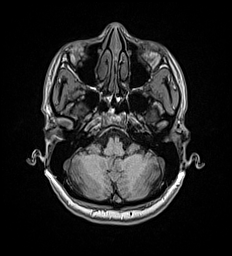
[im 54/176]
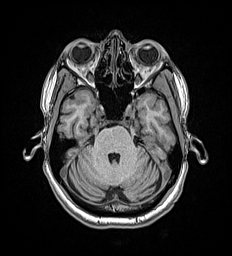
[im 68/176]
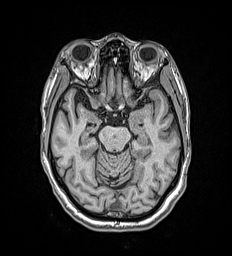
[im 81/176]
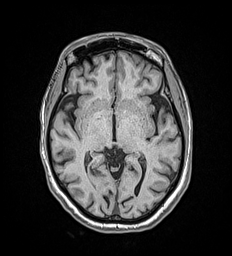
[im 95/176]
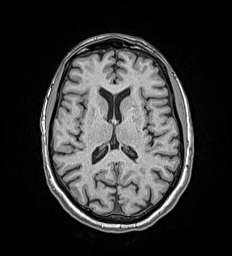
[im 122/176]
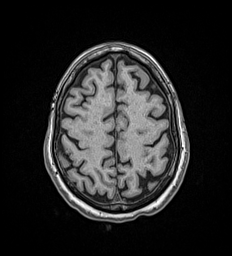
[im 149/176]
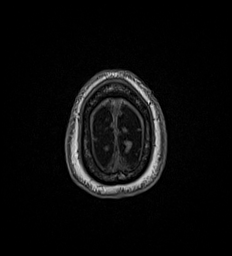
[im 176/176]
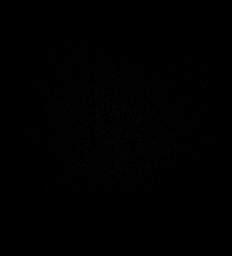

[Series 29: T2 · coronal · 5.0mm · 0.57mm/px · 2 of 29 slices shown (2 of 2)]
[im 1/29]
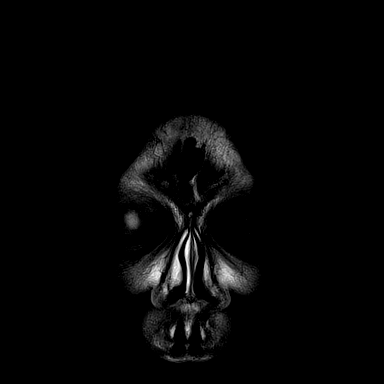
[im 29/29]
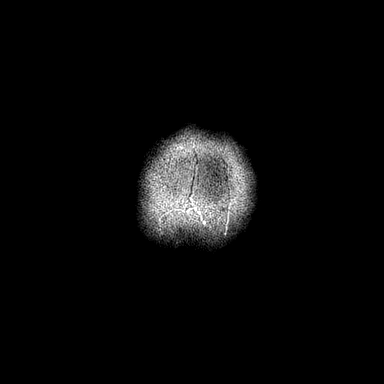

[45 of 48 positions shown; findings below may reference images not displayed]

FINDINGS: Brain: Ventricle size and cerebral volume normal. Numerous small
white matter hyperintensities throughout the cerebral white matter
bilaterally. Brainstem and cerebellum normal.

Negative for acute infarct.  Negative for hemorrhage mass or edema.

Vascular: Normal arterial flow voids

Skull and upper cervical spine: No focal skeletal lesion.

Sinuses/Orbits: Paranasal sinuses clear.  Normal orbit.

Other: None
IMPRESSION: No acute abnormality. Moderate white matter changes most consistent
with chronic microvascular ischemia. Correlate with risk factors for
small vessel disease.

## 2021-07-31 ENCOUNTER — Telehealth: Payer: Self-pay

## 2021-07-31 NOTE — Telephone Encounter (Signed)
Copied from [place] [id number]. Topic: General - Other >> Jul 31, 2021  3:42 PM Yvette Rack wrote: Reason for CRM: Pt stated she completed the cologuard test and she would like a call back regarding some questions she have about completing the paperwork. Cb# [phone]

## 2021-08-07 LAB — FECAL GLOBIN BY IMMUNOCHEMISTRY
FECAL GLOBIN RESULT:: NOT DETECTED
MICRO NUMBER:: 12445665
SPECIMEN QUALITY:: ADEQUATE

## 2021-12-24 ENCOUNTER — Encounter: Payer: Self-pay | Admitting: Physician Assistant

## 2021-12-24 ENCOUNTER — Ambulatory Visit (INDEPENDENT_AMBULATORY_CARE_PROVIDER_SITE_OTHER): Payer: PRIVATE HEALTH INSURANCE | Admitting: Physician Assistant

## 2021-12-24 VITALS — BP 100/64 | HR 92 | Temp 98.1°F | Resp 16 | Ht 68.0 in | Wt 129.8 lb

## 2021-12-24 DIAGNOSIS — Z0000 Encounter for general adult medical examination without abnormal findings: Secondary | ICD-10-CM | POA: Diagnosis not present

## 2021-12-24 DIAGNOSIS — I10 Essential (primary) hypertension: Secondary | ICD-10-CM

## 2021-12-24 DIAGNOSIS — Z1211 Encounter for screening for malignant neoplasm of colon: Secondary | ICD-10-CM

## 2021-12-24 DIAGNOSIS — Z23 Encounter for immunization: Secondary | ICD-10-CM

## 2021-12-24 DIAGNOSIS — Z820 Family history of epilepsy and other diseases of the nervous system: Secondary | ICD-10-CM

## 2021-12-24 DIAGNOSIS — E785 Hyperlipidemia, unspecified: Secondary | ICD-10-CM

## 2021-12-24 DIAGNOSIS — Z0189 Encounter for other specified special examinations: Secondary | ICD-10-CM

## 2021-12-24 DIAGNOSIS — R195 Other fecal abnormalities: Secondary | ICD-10-CM

## 2021-12-24 LAB — COMPREHENSIVE METABOLIC PANEL
AG Ratio: 1.9 (calc) (ref 1.0–2.5)
ALT: 23 U/L (ref 6–29)
AST: 25 U/L (ref 10–35)
Albumin: 4.6 g/dL (ref 3.6–5.1)
Alkaline phosphatase (APISO): 39 U/L (ref 37–153)
BUN/Creatinine Ratio: 15 (calc) (ref 6–22)
BUN: 18 mg/dL (ref 7–25)
CO2: 31 mmol/L (ref 20–32)
Calcium: 9.5 mg/dL (ref 8.6–10.4)
Chloride: 104 mmol/L (ref 98–110)
Creat: 1.21 mg/dL — ABNORMAL HIGH (ref 0.50–1.05)
Globulin: 2.4 g/dL (calc) (ref 1.9–3.7)
Glucose, Bld: 101 mg/dL — ABNORMAL HIGH (ref 65–99)
Potassium: 4.1 mmol/L (ref 3.5–5.3)
Sodium: 141 mmol/L (ref 135–146)
Total Bilirubin: 0.9 mg/dL (ref 0.2–1.2)
Total Protein: 7 g/dL (ref 6.1–8.1)

## 2021-12-24 LAB — CBC WITH DIFFERENTIAL/PLATELET
Absolute Monocytes: 564 cells/uL (ref 200–950)
Basophils Absolute: 11 cells/uL (ref 0–200)
Basophils Relative: 0.2 %
Eosinophils Absolute: 40 cells/uL (ref 15–500)
Eosinophils Relative: 0.7 %
HCT: 42.2 % (ref 35.0–45.0)
Hemoglobin: 14.2 g/dL (ref 11.7–15.5)
Lymphs Abs: 827 cells/uL — ABNORMAL LOW (ref 850–3900)
MCH: 30.2 pg (ref 27.0–33.0)
MCHC: 33.6 g/dL (ref 32.0–36.0)
MCV: 89.8 fL (ref 80.0–100.0)
MPV: 10 fL (ref 7.5–12.5)
Monocytes Relative: 9.9 %
Neutro Abs: 4258 cells/uL (ref 1500–7800)
Neutrophils Relative %: 74.7 %
Platelets: 215 10*3/uL (ref 140–400)
RBC: 4.7 10*6/uL (ref 3.80–5.10)
RDW: 12.4 % (ref 11.0–15.0)
Total Lymphocyte: 14.5 %
WBC: 5.7 10*3/uL (ref 3.8–10.8)

## 2021-12-24 LAB — LIPID PANEL
Cholesterol: 199 mg/dL (ref <200)
HDL: 73 mg/dL (ref >=50)
LDL Cholesterol (Calc): 109 mg/dL (calc) — ABNORMAL HIGH
Non-HDL Cholesterol (Calc): 126 mg/dL (calc) (ref <130)
Total CHOL/HDL Ratio: 2.7 (calc) (ref <5.0)
Triglycerides: 84 mg/dL (ref <150)

## 2021-12-24 MED ORDER — SHINGRIX 50 MCG/0.5ML IM SUSR: 0.5000 mL | Freq: Once | INTRAMUSCULAR | 1 refills | Status: AC

## 2021-12-24 NOTE — Assessment & Plan Note (Signed)
Chronic, historic condition, does not appear managed at this time Previous lipid panel from 06/2021 demonstrates elevated cholesterol and triglycerides Recommend continued exercise and heart healthy diet efforts Lipid panel from today to dictate further management strategies Follow up in 6 months

## 2021-12-24 NOTE — Assessment & Plan Note (Addendum)
Chronic condition currently stable  Well managed with current regimen Continue current medications  Labs to include CBC Encourage continued exercise and diet to assist with cardiovascular health. Follow up in 6 months for monitoring

## 2021-12-24 NOTE — Progress Notes (Signed)
Name: Teresa Dodson   MRN: [id number]    DOB: 20-Oct-1961   Date:12/24/2021       Progress Note  Today's Provider: Talitha Givens, MHS, PA-C Introduced myself to the patient as a PA-C and provided education on APPs in clinical practice.    Subjective  Chief Complaint  Chief Complaint  Patient presents with   Annual Exam    HPI  Patient presents for annual CPE.  Diet: Follows normal diet.  Exercise: Currently walks, plays basketball with grandchildren, runs. In the summer she goes swimming.  Sleep:"good" gets 8-10 hours per night. Feels well rested in AM  Mood:"Good"   She states she is concerned for Alzheimer's since her mother passed from this.   [place] [facility] Visit from 12/24/2021 in [facility]  AUDIT-C Score 1      Depression: Phq 9 is  negative Depression screen [facility] 2/9 12/24/2021 06/20/2021 01/05/2021 08/24/2020 05/25/2020  Decreased Interest 0 0 0 0 0  Down, Depressed, Hopeless 0 0 0 0 0  PHQ - 2 Score 0 0 0 0 0  Altered sleeping 0 0 - - 0  Tired, decreased energy 0 0 - - 0  Change in appetite 0 0 - - 0  Feeling bad or failure about yourself  0 0 - - 0  Trouble concentrating 0 0 - - 0  Moving slowly or fidgety/restless 0 0 - - 0  Suicidal thoughts 0 0 - - 0  PHQ-9 Score 0 0 - - 0  Difficult doing work/chores Not difficult at all Not difficult at all - - Not difficult at all  Some recent data might be hidden   Hypertension: BP Readings from Last 3 Encounters:  12/24/21 100/64  06/20/21 122/80  01/05/21 122/80   Obesity: Wt Readings from Last 3 Encounters:  12/24/21 129 lb 12.8 oz (58.9 kg)  06/20/21 133 lb 11.2 oz (60.6 kg)  01/05/21 141 lb 1.6 oz (64 kg)   BMI Readings from Last 3 Encounters:  12/24/21 19.74 kg/m  06/20/21 20.33 kg/m  01/05/21 21.45 kg/m     Vaccines:   HPV: NA Tdap: next due in 2028 Shingrix: script sent today Pneumonia: not applicable today Flu: NA COVID-19:   Hep C Screening:  STD  testing and prevention (HIV/chl/gon/syphilis):  Intimate partner violence: negative Sexual History : Sexually active with monogamous partner Menstrual History/LMP/Abnormal Bleeding:  Discussed importance of follow up if any post-menopausal bleeding: no Incontinence Symptoms: No.  Breast cancer:  - Last Mammogram: last year- due in Aug - BRCA gene screening:   Osteoporosis Prevention : Discussed high calcium and vitamin D supplementation, weight bearing exercises Bone density :not applicable  Cervical cancer screening:   Skin cancer: Discussed monitoring for atypical lesions  Colorectal cancer: Cologuard ordered today Lung cancer:  Low Dose CT Chest recommended if Age 28-80 years, 20 pack-year currently smoking OR have quit w/in 15years. Patient does not qualify.   ECG: NA  Advanced Care Planning: A voluntary discussion about advance care planning including the explanation and discussion of advance directives.  Discussed health care proxy and Living will, and the patient was able to identify a health care proxy as her husband.  Patient does not know   Lipids: Lab Results  Component Value Date   CHOL 230 (H) 06/20/2021   CHOL 210 (H) 02/24/2020   CHOL 221 (H) 01/03/2016   Lab Results  Component Value Date   HDL 83 06/20/2021   HDL 73  02/24/2020   HDL 91 01/03/2016   Lab Results  Component Value Date   LDLCALC 120 (H) 06/20/2021   LDLCALC 116 (H) 02/24/2020   LDLCALC 117 (H) 01/03/2016   Lab Results  Component Value Date   TRIG 157 (H) 06/20/2021   TRIG 100 02/24/2020   TRIG 64 01/03/2016   Lab Results  Component Value Date   CHOLHDL 2.8 06/20/2021   CHOLHDL 2.9 02/24/2020   CHOLHDL 2.4 01/03/2016   No results found for: LDLDIRECT  Glucose: Glucose, Bld  Date Value Ref Range Status  06/20/2021 81 65 - 99 mg/dL Final    Comment:    .            Fasting reference interval .   02/24/2020 89 65 - 99 mg/dL Final    Comment:    .            Fasting reference  interval .   05/12/2018 81 65 - 139 mg/dL Final    Comment:    .        Non-fasting reference interval .     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    Past Surgical History:  Procedure Laterality Date   ABDOMINAL HYSTERECTOMY     vaginal; partial   AUGMENTATION MAMMAPLASTY      Family History  Problem Relation Age of Onset   Alzheimer's disease Mother    Cancer Father        lung   Breast cancer Paternal Aunt    Diabetes Paternal Grandmother     Social History   Socioeconomic History   Marital status: Married    Spouse name: Audry Pili   Number of children: 2   Years of education: Not on file   Highest education level: Bachelor's degree (e.g., BA, AB, BS)  Occupational History   Not on file  Tobacco Use   Smoking status: Never   Smokeless tobacco: Never  Vaping Use   Vaping Use: Never used  Substance and Sexual Activity   Alcohol use: Yes    Alcohol/week: 2.0 standard drinks    Types: 2 Glasses of wine per week    Comment: ocassionally   Drug use: No   Sexual activity: Yes    Partners: Male  Other Topics Concern   Not on file  Social History Narrative   Not on file   Social Determinants of Health   Financial Resource Strain: Low Risk    Difficulty of Paying Living Expenses: Not hard at all  Food Insecurity: No Food Insecurity   Worried About Charity fundraiser in the Last Year: Never true   [place] in the Last Year:  Never true  Transportation Needs: No Transportation Needs   Lack of Transportation (Medical): No   Lack of Transportation (Non-Medical): No  Physical Activity: Insufficiently Active   Days of Exercise per Week: 3 days   Minutes of Exercise per Session: 30 min  Stress: No Stress Concern Present  Feeling of Stress : Not at all  Social Connections: Moderately Integrated   Frequency of Communication with Friends and Family: More than three times a week   Frequency of Social Gatherings with Friends and Family: More than three times a week   Attends Religious Services: More than 4 times per year   Active Member of [employer] or Organizations: No   Attends Archivist Meetings: Never   Marital Status: Married  Human resources officer Violence: Not At Risk   Fear of Current or Ex-Partner: No   Emotionally Abused: No   Physically Abused: No   Sexually Abused: No     Current Outpatient Medications:    amLODipine-benazepril (LOTREL) 5-10 MG capsule, Take 1 capsule by mouth daily., Disp: 90 capsule, Rfl: 3   cholecalciferol (VITAMIN D) 1000 units tablet, Take 1,000 Units by mouth daily., Disp: , Rfl:    Multiple Vitamins-Minerals (MULTIPLE VITAMINS/WOMENS PO), Take by mouth., Disp: , Rfl:    Zoster Vaccine Adjuvanted (SHINGRIX) injection, Inject 0.5 mLs into the muscle once for 1 dose., Disp: 0.5 mL, Rfl: 1  Allergies  Allergen Reactions   Sulfamethoxazole-Trimethoprim Rash     Review of Systems  Constitutional:  Positive for weight loss (expected). Negative for chills, diaphoresis, fever and malaise/fatigue.  HENT: Negative.    Eyes: Negative.   Respiratory: Negative.    Cardiovascular: Negative.   Gastrointestinal:  Positive for diarrhea. Negative for abdominal pain, blood in stool, constipation, heartburn, melena, nausea and vomiting.  Genitourinary:  Negative for dysuria, flank pain, frequency, hematuria and urgency.  Musculoskeletal: Negative.  Negative for back pain, falls, joint  pain, myalgias and neck pain.  Skin:  Negative for itching and rash.  Neurological: Negative.  Negative for dizziness, tingling, tremors, speech change, seizures, loss of consciousness, weakness and headaches.  Endo/Heme/Allergies:  Negative for polydipsia. Does not bruise/bleed easily.  Psychiatric/Behavioral:  Negative for depression, memory loss and suicidal ideas. The patient is not nervous/anxious and does not have insomnia.      Objective  Vitals:   12/24/21 1049  BP: 100/64  Pulse: 92  Resp: 16  Temp: 98.1 F (36.7 C)  TempSrc: Oral  SpO2: 98%  Weight: 129 lb 12.8 oz (58.9 kg)  Height: 5' 8"  (1.727 m)    Body mass index is 19.74 kg/m.  Physical Exam Vitals reviewed.  Constitutional:      Appearance: Normal appearance. She is normal weight.  HENT:     Head: Normocephalic and atraumatic.     Right Ear: Hearing, tympanic membrane, ear canal and external ear normal.     Left Ear: Hearing, tympanic membrane, ear canal and external ear normal.     Nose: Nose normal.     Mouth/Throat:     Mouth: Mucous membranes are moist.     Pharynx: No oropharyngeal exudate or posterior oropharyngeal erythema.  Eyes:     General: Lids are normal.     Extraocular Movements: Extraocular movements intact.     Conjunctiva/sclera: Conjunctivae normal.     Pupils: Pupils are equal, round, and reactive to light.  Neck:     Thyroid: No thyroid mass, thyromegaly or thyroid tenderness.     Trachea: Trachea and phonation normal.  Cardiovascular:     Rate and Rhythm: Normal rate and regular rhythm.     Pulses: Normal pulses.     Heart sounds: Normal heart sounds.  Pulmonary:     Effort: Pulmonary effort is normal.     Breath sounds: Normal breath sounds. No  decreased breath sounds, wheezing, rhonchi or rales.  Abdominal:     General: Abdomen is flat. Bowel sounds are normal.     Palpations: Abdomen is soft. There is no hepatomegaly.     Tenderness: There is no abdominal tenderness.   Musculoskeletal:     Cervical back: Normal range of motion and neck supple.     Right lower leg: No edema.     Left lower leg: No edema.  Lymphadenopathy:     Head:     Right side of head: No submental or submandibular adenopathy.     Left side of head: No submental or submandibular adenopathy.     Upper Body:     Right upper body: No supraclavicular adenopathy.     Left upper body: No supraclavicular adenopathy.  Skin:    General: Skin is warm and dry.     Capillary Refill: Capillary refill takes less than 2 seconds.  Neurological:     General: No focal deficit present.     Mental Status: She is alert and oriented to person, place, and time.     GCS: GCS eye subscore is 4. GCS verbal subscore is 5. GCS motor subscore is 6.     Cranial Nerves: No cranial nerve deficit.     Sensory: No sensory deficit.     Motor: Motor function is intact. No weakness or tremor.     Deep Tendon Reflexes:     Reflex Scores:      Tricep reflexes are 2+ on the right side and 2+ on the left side.      Patellar reflexes are 2+ on the right side and 2+ on the left side. Psychiatric:        Attention and Perception: Attention and perception normal.        Mood and Affect: Mood and affect normal.        Speech: Speech normal.        Behavior: Behavior normal. Behavior is cooperative.        Thought Content: Thought content normal.        Cognition and Memory: Cognition normal.        Judgment: Judgment normal.     No results found for this or any previous visit (from the past 2160 hour(s)).   Fall Risk: Fall Risk  12/24/2021 06/20/2021 01/05/2021 08/24/2020 05/25/2020  Falls in the past year? 0 0 0 0 0  Number falls in past yr: 0 0 0 0 0  Injury with Fall? 0 0 - 0 0  Risk for fall due to : No Fall Risks - - - -  Follow up Falls prevention discussed - Falls prevention discussed - Falls evaluation completed     Functional Status Survey: Is the patient deaf or have difficulty hearing?: No Does the  patient have difficulty seeing, even when wearing glasses/contacts?: No Does the patient have difficulty concentrating, remembering, or making decisions?: Yes Does the patient have difficulty walking or climbing stairs?: No Does the patient have difficulty dressing or bathing?: No Does the patient have difficulty doing errands alone such as visiting a doctor's office or shopping?: No   Assessment & Plan    -USPSTF grade A and B recommendations reviewed with patient; age-appropriate recommendations, preventive care, screening tests, etc discussed and encouraged; healthy living encouraged; see AVS for patient education given to patient -Discussed importance of 150 minutes of physical activity weekly, eat two servings of fish weekly, eat one serving of tree nuts ( cashews,  pistachios, pecans, almonds.Marland Kitchen) every other day, eat 6 servings of fruit/vegetables daily and drink plenty of water and avoid sweet beverages.   -Reviewed Health Maintenance: Yes.    Problem List Items Addressed This Visit       Cardiovascular and Mediastinum   Hypertension, goal below 140/90    Chronic condition currently stable  Well managed with current regimen Continue current medications  Labs to include CBC Encourage continued exercise and diet to assist with cardiovascular health. Follow up in 6 months for monitoring        Relevant Orders   Comprehensive Metabolic Panel (CMET)   CBC w/Diff/Platelet     Other   Family history of Alzheimer's disease    Patient has concerns for developing Alzheimer's disease Recommend following with MMSE during annual physical exams to establish baseline and monitor overall status.  Patient in agreement to monitor and discuss as dictated by results at future visits       Hyperlipidemia    Chronic, historic condition, does not appear managed at this time Previous lipid panel from 06/2021 demonstrates elevated cholesterol and triglycerides Recommend continued exercise and  heart healthy diet efforts Lipid panel from today to dictate further management strategies Follow up in 6 months      Relevant Orders   Lipid Profile   Other Visit Diagnoses     Encounter for annual physical exam    -  Primary   Need for shingles vaccine       Relevant Medications   Zoster Vaccine Adjuvanted [facility]) injection   Screening for colon cancer       Relevant Orders   Cologuard   Routine lab draw       Relevant Orders   Comprehensive Metabolic Panel (CMET)   Lipid Profile   CBC w/Diff/Platelet        No follow-ups on file.   I, Keosha Rossa E Nakaya Mishkin, PA-C, have reviewed all documentation for this visit. The documentation on 12/24/21 for the exam, diagnosis, procedures, and orders are all accurate and complete.   Merline Perkin, Glennie Isle MPH [facility]

## 2021-12-24 NOTE — Assessment & Plan Note (Signed)
Patient has concerns for developing Alzheimer's disease Recommend following with MMSE during annual physical exams to establish baseline and monitor overall status.  Patient in agreement to monitor and discuss as dictated by results at future visits

## 2021-12-25 ENCOUNTER — Telehealth: Payer: Self-pay

## 2021-12-25 NOTE — Telephone Encounter (Signed)
Retried calling pt no answer, created CRM for nurse triage to be able to share lab results if patient calls back.

## 2021-12-25 NOTE — Telephone Encounter (Signed)
Copied from [place] [phone]. Topic: General - Call Back - No Documentation >> Dec 25, 2021 12:10 PM Erick Blinks wrote: Reason for CRM: Pt called requesting to discuss her lab results, please if [facility] may disclose

## 2022-01-04 ENCOUNTER — Encounter: Payer: Self-pay | Admitting: Family Medicine

## 2022-01-10 ENCOUNTER — Encounter: Payer: Self-pay | Admitting: Family Medicine

## 2022-01-17 ENCOUNTER — Telehealth: Payer: Self-pay

## 2022-01-17 LAB — COLOGUARD: COLOGUARD: POSITIVE — AB

## 2022-01-17 NOTE — Telephone Encounter (Signed)
CALLED PATIENT NO ANSWER LEFT VOICEMAIL FOR A CALL BACK ?TO SCHEDULE NP COLONOSCOPY  ?

## 2022-01-18 ENCOUNTER — Telehealth: Payer: Self-pay | Admitting: Family Medicine

## 2022-01-18 ENCOUNTER — Other Ambulatory Visit: Payer: Self-pay

## 2022-01-18 DIAGNOSIS — Z1211 Encounter for screening for malignant neoplasm of colon: Secondary | ICD-10-CM

## 2022-01-18 MED ORDER — NA SULFATE-K SULFATE-MG SULF 17.5-3.13-1.6 GM/177ML PO SOLN: 1.0000 | Freq: Once | ORAL | 0 refills | Status: AC

## 2022-01-18 NOTE — Progress Notes (Signed)
Gastroenterology Pre-Procedure Review ? ?Request Date: 02/07/2022 ?Requesting Physician: Dr. Vicente Males ? ?PATIENT REVIEW QUESTIONS: The patient responded to the following health history questions as indicated:   ? ?1. Are you having any GI issues? no ?2. Do you have a personal history of Polyps? no ?3. Do you have a family history of Colon Cancer or Polyps? no ?4. Diabetes Mellitus? no ?5. Joint replacements in the past 12 months?no ?6. Major health problems in the past 3 months?no ?7. Any artificial heart valves, MVP, or defibrillator?no ?   ?MEDICATIONS & ALLERGIES:    ?Patient reports the following regarding taking any anticoagulation/antiplatelet therapy:   ?Plavix, Coumadin, Eliquis, Xarelto, Lovenox, Pradaxa, Brilinta, or Effient? no ?Aspirin? no ? ?Patient confirms/reports the following medications:  ?Current Outpatient Medications  ?Medication Sig Dispense Refill  ? amLODipine-benazepril (LOTREL) 5-10 MG capsule Take 1 capsule by mouth daily. 90 capsule 3  ? cholecalciferol (VITAMIN D) 1000 units tablet Take 1,000 Units by mouth daily.    ? Multiple Vitamins-Minerals (MULTIPLE VITAMINS/WOMENS PO) Take by mouth.    ? ?No current facility-administered medications for this visit.  ? ? ?Patient confirms/reports the following allergies:  ?Allergies  ?Allergen Reactions  ? Sulfamethoxazole-Trimethoprim Rash  ? ? ?No orders of the defined types were placed in this encounter. ? ? ?AUTHORIZATION INFORMATION ?Primary Insurance: ?1D#: ?Group #: ? ?Secondary Insurance: ?1D#: ?Group #: ? ?SCHEDULE INFORMATION: ?Date: 02/07/2022 ?Time: ?Location: ?[facility] ?

## 2022-01-18 NOTE — Telephone Encounter (Signed)
Pt left message returning call from yesterday to sched Colonoscopy ?

## 2022-02-07 ENCOUNTER — Ambulatory Visit
Admission: RE | Admit: 2022-02-07 | Discharge: 2022-02-07 | Disposition: A | Discharge status: Home / Self Care | Payer: 59 | Attending: Gastroenterology | Admitting: Gastroenterology

## 2022-02-07 ENCOUNTER — Encounter
Admission: RE | Disposition: A | Discharge status: Home / Self Care | Payer: Self-pay | Source: Home / Self Care | Attending: Gastroenterology

## 2022-02-07 ENCOUNTER — Other Ambulatory Visit: Payer: Self-pay

## 2022-02-07 ENCOUNTER — Encounter: Payer: Self-pay | Admitting: Gastroenterology

## 2022-02-07 ENCOUNTER — Ambulatory Visit: Payer: 59 | Admitting: Certified Registered Nurse Anesthetist

## 2022-02-07 DIAGNOSIS — I10 Essential (primary) hypertension: Secondary | ICD-10-CM | POA: Diagnosis not present

## 2022-02-07 DIAGNOSIS — Z1211 Encounter for screening for malignant neoplasm of colon: Secondary | ICD-10-CM | POA: Insufficient documentation

## 2022-02-07 DIAGNOSIS — K5730 Diverticulosis of large intestine without perforation or abscess without bleeding: Secondary | ICD-10-CM | POA: Insufficient documentation

## 2022-02-07 HISTORY — PX: COLONOSCOPY WITH PROPOFOL: SHX5780

## 2022-02-07 SURGERY — COLONOSCOPY WITH PROPOFOL
Anesthesia: General

## 2022-02-07 MED ORDER — SODIUM CHLORIDE 0.9 % IV SOLN: INTRAVENOUS | Status: DC

## 2022-02-07 MED ORDER — PROPOFOL 10 MG/ML IV BOLUS
INTRAVENOUS | Status: DC | PRN
  Administered 2022-02-07: 60 mg via INTRAVENOUS

## 2022-02-07 MED ORDER — LIDOCAINE HCL (CARDIAC) PF 100 MG/5ML IV SOSY
PREFILLED_SYRINGE | INTRAVENOUS | Status: DC | PRN
  Administered 2022-02-07: 50 mg via INTRAVENOUS

## 2022-02-07 MED ORDER — PROPOFOL 500 MG/50ML IV EMUL
INTRAVENOUS | Status: DC | PRN
  Administered 2022-02-07: 140 ug/kg/min via INTRAVENOUS

## 2022-02-07 NOTE — Transfer of Care (Signed)
Immediate Anesthesia Transfer of Care Note ? ?Patient: Teresa Dodson ? ?Procedure(s) Performed: COLONOSCOPY WITH PROPOFOL ? ?Patient Location: Endoscopy Unit ? ?Anesthesia Type:General ? ?Level of Consciousness: drowsy ? ?Airway & Oxygen Therapy: Patient Spontanous Breathing ? ?Post-op Assessment: Report given to RN and Post -op Vital signs reviewed and stable ? ?Post vital signs: Reviewed and stable ? ?Last Vitals:  ?Vitals Value Taken Time  ?BP 101/68   ?Temp    ?Pulse 65 02/07/22 0920  ?Resp 14 02/07/22 0920  ?SpO2 100 % 02/07/22 0920  ?Vitals shown include unvalidated device data. ? ?Last Pain:  ?Vitals:  ? 02/07/22 0803  ?TempSrc: Temporal  ?PainSc: 0-No pain  ?   ? ?  ? ?Complications: No notable events documented. ?

## 2022-02-07 NOTE — H&P (Signed)
? ? ? ?Jonathon Bellows, MD ?[address] [place], [place], [place], [address] ?[address] [place], [place], [place], [address] ?Phone: [phone]  ?Fax: [phone] ? ?Primary Care Physician:  Delsa Grana, PA-C ? ? ?Pre-Procedure History & Physical: ?HPI:  Teresa Dodson is a 61 y.o. female is here for an colonoscopy. ?  ?Past Medical History:  ?Diagnosis Date  ? Allergic rhinitis with postnasal drip   ? Cystadenofibroma of left ovary   ? History of nephrolithiasis   ? HTN, goal below 140/90   ? Kidney stone   ? Left-sided Bell's palsy   ? ? ?Past Surgical History:  ?Procedure Laterality Date  ? ABDOMINAL HYSTERECTOMY    ? vaginal; partial  ? AUGMENTATION MAMMAPLASTY    ? ? ?Prior to Admission medications   ?Medication Sig Start Date End Date Taking? Authorizing Provider  ?amLODipine-benazepril (LOTREL) 5-10 MG capsule Take 1 capsule by mouth daily. 06/20/21  Yes Delsa Grana, PA-C  ?cholecalciferol (VITAMIN D) 1000 units tablet Take 1,000 Units by mouth daily.   Yes [provider]  ?Multiple Vitamins-Minerals (MULTIPLE VITAMINS/WOMENS PO) Take by mouth.   Yes [provider]  ? ? ?Allergies as of 01/18/2022 - Review Complete 01/18/2022  ?Allergen Reaction Noted  ? Sulfamethoxazole-trimethoprim Rash 06/22/2015  ? ? ?Family History  ?Problem Relation Age of Onset  ? Alzheimer's disease Mother   ? Cancer Father   ?     lung  ? Breast cancer Paternal Aunt   ? Diabetes Paternal Grandmother   ? ? ?Social History  ? ?Socioeconomic History  ? Marital status: Married  ?  Spouse name: Audry Pili  ? Number of children: 2  ? Years of education: Not on file  ? Highest education level: Bachelor's degree (e.g., BA, AB, BS)  ?Occupational History  ? Not on file  ?Tobacco Use  ? Smoking status: Never  ? Smokeless tobacco: Never  ?Vaping Use  ? Vaping Use: Never used  ?Substance and Sexual Activity  ? Alcohol use: Yes  ?  Alcohol/week: 2.0 standard drinks  ?  Types: 2 Glasses of wine per week  ?  Comment:  ocassionally  ? Drug use: No  ? Sexual activity: Yes  ?  Partners: Male  ?Other Topics Concern  ? Not on file  ?Social History Narrative  ? Not on file  ? ?Social Determinants of Health  ? ?Financial Resource Strain: Low Risk   ? Difficulty of Paying Living Expenses: Not hard at all  ?Food Insecurity: No Food Insecurity  ? Worried About Charity fundraiser in the Last Year: Never true  ? Ran Out of Food in the Last Year: Never true  ?Transportation Needs: No Transportation Needs  ? Lack of Transportation (Medical): No  ? Lack of Transportation (Non-Medical): No  ?Physical Activity: Insufficiently Active  ? Days of Exercise per Week: 3 days  ? Minutes of Exercise per Session: 30 min  ?Stress: No Stress Concern Present  ? Feeling of Stress : Not at all  ?Social Connections: Moderately Integrated  ? Frequency of Communication with Friends and Family: More than three times a week  ? Frequency of Social Gatherings with Friends and Family: More than three times a week  ? Attends Religious Services: More than 4 times per year  ? Active Member of Clubs or Organizations: No  ? Attends Archivist Meetings: Never  ? Marital Status: Married  ?Intimate Partner Violence: Not At Risk  ? Fear of Current or Ex-Partner:  No  ? Emotionally Abused: No  ? Physically Abused: No  ? Sexually Abused: No  ? ? ?Review of Systems: ?See HPI, otherwise negative ROS ? ?Physical Exam: ?BP 123/87   Pulse 61   Temp 97.7 ?F (36.5 ?C) (Temporal)   Resp 16   Ht 5' 7.5" (1.715 m)   Wt 59 kg   SpO2 100%   BMI 20.06 kg/m?  ?General:   Alert,  pleasant and cooperative in NAD ?Head:  Normocephalic and atraumatic. ?Neck:  Supple; no masses or thyromegaly. ?Lungs:  Clear throughout to auscultation, normal respiratory effort.    ?Heart:  +S1, +S2, Regular rate and rhythm, No edema. ?Abdomen:  Soft, nontender and nondistended. Normal bowel sounds, without guarding, and without rebound.   ?Neurologic:  Alert and  oriented x4;  grossly normal  neurologically. ? ?Impression/Plan: ?Teresa Dodson is here for an colonoscopy to be performed for Screening colonoscopy average risk   ?Risks, benefits, limitations, and alternatives regarding  colonoscopy have been reviewed with the patient.  Questions have been answered.  All parties agreeable. ? ? ?Jonathon Bellows, MD  02/07/2022, 8:46 AM ? ?

## 2022-02-07 NOTE — Anesthesia Procedure Notes (Signed)
Date/Time: 02/07/2022 8:59 AM ?Performed by: Lily Peer, Temia Debroux, CRNA ?Pre-anesthesia Checklist: Patient identified, Emergency Drugs available, Suction available, Patient being monitored and Timeout performed ?Patient Re-evaluated:Patient Re-evaluated prior to induction ?Oxygen Delivery Method: Nasal cannula ?Induction Type: IV induction ? ? ? ? ?

## 2022-02-07 NOTE — Anesthesia Preprocedure Evaluation (Signed)
Anesthesia Evaluation  ?Patient identified by MRN, date of birth, ID band ?Patient awake ? ? ? ?Reviewed: ?Allergy & Precautions, NPO status , Patient's Chart, lab work & pertinent test results ? ?Airway ?Mallampati: III ? ?TM Distance: >3 FB ?Neck ROM: full ? ? ? Dental ? ?(+) Missing ?  ?Pulmonary ?neg pulmonary ROS,  ?  ?Pulmonary exam normal ? ? ? ? ? ? ? Cardiovascular ?hypertension, Normal cardiovascular exam ? ? ?  ?Neuro/Psych ?negative neurological ROS ? negative psych ROS  ? GI/Hepatic ?negative GI ROS, Neg liver ROS,   ?Endo/Other  ?negative endocrine ROS ? Renal/GU ?negative Renal ROS  ?negative genitourinary ?  ?Musculoskeletal ? ? Abdominal ?  ?Peds ? Hematology ?negative hematology ROS ?(+)   ?Anesthesia Other Findings ?Past Medical History: ?No date: Allergic rhinitis with postnasal drip ?No date: Cystadenofibroma of left ovary ?No date: History of nephrolithiasis ?No date: HTN, goal below 140/90 ?No date: Kidney stone ?No date: Left-sided Bell's palsy ? ?Past Surgical History: ?No date: ABDOMINAL HYSTERECTOMY ?    Comment:  vaginal; partial ?No date: AUGMENTATION MAMMAPLASTY ? ?BMI   ? Body Mass Index: 20.06 kg/m?  ?  ? ? Reproductive/Obstetrics ?negative OB ROS ? ?  ? ? ? ? ? ? ? ? ? ? ? ? ? ?  ?  ? ? ? ? ? ? ? ? ?Anesthesia Physical ?Anesthesia Plan ? ?ASA: 2 ? ?Anesthesia Plan: General  ? ?Post-op Pain Management:   ? ?Induction: Intravenous ? ?PONV Risk Score and Plan: Propofol infusion and TIVA ? ?Airway Management Planned: Natural Airway ? ?Additional Equipment:  ? ?Intra-op Plan:  ? ?Post-operative Plan:  ? ?Informed Consent: I have reviewed the patients History and Physical, chart, labs and discussed the procedure including the risks, benefits and alternatives for the proposed anesthesia with the patient or authorized representative who has indicated his/her understanding and acceptance.  ? ? ? ?Dental Advisory Given ? ?Plan Discussed with: Anesthesiologist,  CRNA and Surgeon ? ?Anesthesia Plan Comments:   ? ? ? ? ? ? ?Anesthesia Quick Evaluation ? ?

## 2022-02-07 NOTE — Op Note (Signed)
[facility] ?Gastroenterology ?Patient Name: Teresa Dodson ?Procedure Date: 02/07/2022 8:57 AM ?MRN: [id number] ?Account #: [id number] ?Date of Birth: 1961/04/04 ?Admit Type: Outpatient ?Age: 61 ?Room: [facility] ENDO ROOM 3 ?Gender: Female ?Note Status: Finalized ?Instrument Name: Colonscope [id number] ?Procedure:             Colonoscopy ?Indications:           Screening for colorectal malignant neoplasm ?Providers:             Jonathon Bellows MD, MD ?Referring MD:          Delsa Grana (Referring MD) ?Medicines:             Monitored Anesthesia Care ?Complications:         No immediate complications. ?Procedure:             Pre-Anesthesia Assessment: ?                       - Prior to the procedure, a History and Physical was  ?                       performed, and patient medications, allergies and  ?                       sensitivities were reviewed. The patient's tolerance  ?                       of previous anesthesia was reviewed. ?                       - The risks and benefits of the procedure and the  ?                       sedation options and risks were discussed with the  ?                       patient. All questions were answered and informed  ?                       consent was obtained. ?                       - ASA Grade Assessment: II - A patient with mild  ?                       systemic disease. ?                       After obtaining informed consent, the colonoscope was  ?                       passed under direct vision. Throughout the procedure,  ?                       the patient's blood pressure, pulse, and oxygen  ?                       saturations were monitored continuously. The  ?                       Colonoscope was introduced through  the anus and  ?                       advanced to the the cecum, identified by the  ?                       appendiceal orifice. The colonoscopy was performed  ?                       with ease. The patient tolerated the procedure well.  ?                        The quality of the bowel preparation was excellent. ?Findings: ?     The perianal and digital rectal examinations were normal. ?     Multiple small and large-mouthed diverticula were found in the entire  ?     colon. ?     The exam was otherwise without abnormality on direct and retroflexion  ?     views. ?Impression:            - Diverticulosis in the entire examined colon. ?                       - The examination was otherwise normal on direct and  ?                       retroflexion views. ?                       - No specimens collected. ?Recommendation:        - Discharge patient to home (with escort). ?                       - Resume previous diet. ?                       - Continue present medications. ?                       - Repeat colonoscopy in 10 years for screening  ?                       purposes. ?Procedure Code(s):     --- Professional --- ?                       [phone], Colonoscopy, flexible; diagnostic, including  ?                       collection of specimen(s) by brushing or washing, when  ?                       performed (separate procedure) ?Diagnosis Code(s):     --- Professional --- ?                       Z12.11, Encounter for screening for malignant neoplasm  ?                       of colon ?  K57.30, Diverticulosis of large intestine without  ?                       perforation or abscess without bleeding ?CPT copyright 2019 [employer]. All rights reserved. ?The codes documented in this report are preliminary and upon coder review may  ?be revised to meet current compliance requirements. ?Jonathon Bellows, MD ?Jonathon Bellows MD, MD ?02/07/2022 9:18:37 AM ?This report has been signed electronically. ?Number of Addenda: 0 ?Note Initiated On: 02/07/2022 8:57 AM ?Scope Withdrawal Time: 0 hours 9 minutes 55 seconds  ?Total Procedure Duration: 0 hours 14 minutes 57 seconds  ?Estimated Blood Loss:  Estimated blood loss: none. ?     [facility] ?

## 2022-02-09 NOTE — Anesthesia Postprocedure Evaluation (Signed)
Anesthesia Post Note ? ?Patient: Addilynne Olheiser ? ?Procedure(s) Performed: COLONOSCOPY WITH PROPOFOL ? ?Patient location during evaluation: Endoscopy ?Anesthesia Type: General ?Level of consciousness: awake and alert ?Pain management: pain level controlled ?Vital Signs Assessment: post-procedure vital signs reviewed and stable ?Respiratory status: spontaneous breathing, nonlabored ventilation and respiratory function stable ?Cardiovascular status: blood pressure returned to baseline and stable ?Postop Assessment: no apparent nausea or vomiting ?Anesthetic complications: no ? ? ?No notable events documented. ? ? ?Last Vitals:  ?Vitals:  ? 02/07/22 0940 02/07/22 0950  ?BP: 102/60 119/86  ?Pulse: (!) 52 (!) 57  ?Resp: 13 16  ?Temp:    ?SpO2: 100% 100%  ?  ?Last Pain:  ?Vitals:  ? 02/07/22 0950  ?TempSrc:   ?PainSc: 0-No pain  ? ? ?  ?  ?  ?  ?  ?  ? ?[place] Ouch ? ? ? ? ?

## 2022-02-11 ENCOUNTER — Encounter: Payer: Self-pay | Admitting: Gastroenterology

## 2022-02-26 NOTE — Telephone Encounter (Signed)
error 

## 2022-06-05 ENCOUNTER — Encounter: Payer: Self-pay | Admitting: Family Medicine

## 2022-06-05 ENCOUNTER — Ambulatory Visit (INDEPENDENT_AMBULATORY_CARE_PROVIDER_SITE_OTHER): Payer: PRIVATE HEALTH INSURANCE | Admitting: Family Medicine

## 2022-06-05 VITALS — BP 116/70 | HR 70 | Temp 98.2°F | Resp 16 | Ht 67.0 in | Wt 125.4 lb

## 2022-06-05 DIAGNOSIS — R6881 Early satiety: Secondary | ICD-10-CM

## 2022-06-05 DIAGNOSIS — R634 Abnormal weight loss: Secondary | ICD-10-CM

## 2022-06-05 MED ORDER — OMEPRAZOLE 20 MG PO CPDR
20.0000 mg | DELAYED_RELEASE_CAPSULE | Freq: Every day | ORAL | 3 refills | Status: DC
Start: 2022-06-05 — End: 2023-06-10

## 2022-06-05 NOTE — Patient Instructions (Addendum)
Get and take Prilosec in the morning on an empty stomach for 2 weeks and we'll see if this affects your appetite and how full you feel and get.  Increase calories in your foods, add high protein or higher fat foods into what you are currently eating, or add a boost or ensure a day.

## 2022-06-05 NOTE — Progress Notes (Signed)
Patient ID: Teresa Dodson, female    DOB: 1961-02-24, 61 y.o.   MRN: [id number]  PCP: Delsa Grana, PA-C  Chief Complaint  Patient presents with   Weight Loss    Pt has been loosing weight and has lost appetite.    Subjective:   Teresa Dodson is a 61 y.o. female, presents to clinic with CC of the following:  HPI   Unintentional weight loss Early fullness with eating, slight loss of appetite - eating less than her normal, this concerned her husband with roughly 4 to 5 pounds of unintentional weight loss so she presents today for evaluation She mentions that she has many of her grandchildren at her house very often they are very busy and this is been a change in activity at home also slightly increased her stress. 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      Current Outpatient Medications:    amLODipine-benazepril (LOTREL) 5-10 MG capsule, Take 1 capsule by mouth daily., Disp: 90 capsule, Rfl: 3   cholecalciferol (VITAMIN D) 1000 units tablet, Take 1,000 Units by mouth daily., Disp: , Rfl:     Multiple Vitamins-Minerals (MULTIPLE VITAMINS/WOMENS PO), Take by mouth., Disp: , Rfl:    Allergies  Allergen Reactions   Sulfamethoxazole-Trimethoprim Rash     Social History   Tobacco Use   Smoking status: Never   Smokeless tobacco: Never  Vaping Use   Vaping Use: Never used  Substance Use Topics   Alcohol use: Yes    Alcohol/week: 2.0 standard drinks of alcohol    Types: 2 Glasses of wine per week    Comment: ocassionally   Drug use: No      Chart Review Today: I personally reviewed active problem list, medication list, allergies, family history, social history, health maintenance, notes from last encounter, lab results, imaging with the patient/caregiver today.   Review of Systems  Constitutional: Negative.   HENT: Negative.    Eyes: Negative.   Respiratory: Negative.    Cardiovascular: Negative.   Gastrointestinal: Negative.   Endocrine: Negative.   Genitourinary: Negative.   Musculoskeletal: Negative.   Skin: Negative.   Allergic/Immunologic: Negative.   Neurological: Negative.   Hematological: Negative.   Psychiatric/Behavioral: Negative.    All other systems reviewed and are negative.      Objective:   Vitals:   06/05/22 0912  BP: 116/70  Pulse: 70  Resp: 16  Temp: 98.2 F (36.8 C)  TempSrc: Oral  SpO2: 99%  Weight: 125 lb 6.4 oz (56.9 kg)  Height: '5\' 7"'$  (1.702 m)    Body mass index is 19.64 kg/m.  Physical Exam Vitals and nursing note reviewed.  Constitutional:  General: She is not in acute distress.    Appearance: Normal appearance. She is well-developed and well-groomed. She is not ill-appearing, toxic-appearing or diaphoretic.     Comments: Appears thin/lean but not cachectic  HENT:     Head: Normocephalic and atraumatic.     Right Ear: External ear normal.     Left Ear: External ear normal.     Nose: Nose normal.  Eyes:     General:        Right eye: No discharge.        Left eye: No discharge.      Conjunctiva/sclera: Conjunctivae normal.  Neck:     Trachea: No tracheal deviation.  Cardiovascular:     Rate and Rhythm: Normal rate and regular rhythm.     Pulses: Normal pulses.     Heart sounds: Normal heart sounds. No murmur heard.    No friction rub. No gallop.  Pulmonary:     Effort: Pulmonary effort is normal. No respiratory distress.     Breath sounds: Normal breath sounds. No stridor. No wheezing, rhonchi or rales.  Abdominal:     General: Bowel sounds are normal. There is no distension.     Palpations: Abdomen is soft.     Tenderness: There is no abdominal tenderness. There is no guarding or rebound.  Musculoskeletal:     Right lower leg: No edema.     Left lower leg: No edema.  Skin:    General: Skin is warm and dry.     Capillary Refill: Capillary refill takes less than 2 seconds.     Findings: No rash.  Neurological:     Mental Status: She is alert.     Motor: No abnormal muscle tone.     Coordination: Coordination normal.  Psychiatric:        Mood and Affect: Mood normal.        Behavior: Behavior normal. Behavior is cooperative.      Results for orders placed or performed in visit on 12/24/21  Cologuard  Result Value Ref Range   COLOGUARD Positive (A) Negative  Comprehensive Metabolic Panel (CMET)  Result Value Ref Range   Glucose, Bld 101 (H) 65 - 99 mg/dL   BUN 18 7 - 25 mg/dL   Creat 1.21 (H) 0.50 - 1.05 mg/dL   BUN/Creatinine Ratio 15 6 - 22 (calc)   Sodium 141 135 - 146 mmol/L   Potassium 4.1 3.5 - 5.3 mmol/L   Chloride 104 98 - 110 mmol/L   CO2 31 20 - 32 mmol/L   Calcium 9.5 8.6 - 10.4 mg/dL   Total Protein 7.0 6.1 - 8.1 g/dL   Albumin 4.6 3.6 - 5.1 g/dL   Globulin 2.4 1.9 - 3.7 g/dL (calc)   AG Ratio 1.9 1.0 - 2.5 (calc)   Total Bilirubin 0.9 0.2 - 1.2 mg/dL   Alkaline phosphatase (APISO) 39 37 - 153 U/L   AST 25 10 - 35 U/L   ALT 23 6 - 29 U/L  Lipid Profile  Result Value Ref Range   Cholesterol 199 <200 mg/dL   HDL 73 > OR = 50 mg/dL    Triglycerides 84 <150 mg/dL   LDL Cholesterol (Calc) 109 (H) mg/dL (calc)   Total CHOL/HDL Ratio 2.7 <5.0 (calc)   Non-HDL Cholesterol (Calc) 126 <130 mg/dL (calc)  CBC w/Diff/Platelet  Result Value Ref Range   WBC 5.7 3.8 - 10.8 Thousand/uL   RBC 4.70 3.80 - 5.10 Million/uL   Hemoglobin 14.2  11.7 - 15.5 g/dL   HCT 42.2 35.0 - 45.0 %   MCV 89.8 80.0 - 100.0 fL   MCH 30.2 27.0 - 33.0 pg   MCHC 33.6 32.0 - 36.0 g/dL   RDW 12.4 11.0 - 15.0 %   Platelets 215 140 - 400 Thousand/uL   MPV 10.0 7.5 - 12.5 fL   Neutro Abs 4,258 1,500 - 7,800 cells/uL   Lymphs Abs 827 (L) 850 - 3,900 cells/uL   Absolute Monocytes 564 200 - 950 cells/uL   Eosinophils Absolute 40 15 - 500 cells/uL   Basophils Absolute 11 0 - 200 cells/uL   Neutrophils Relative % 74.7 %   Total Lymphocyte 14.5 %   Monocytes Relative 9.9 %   Eosinophils Relative 0.7 %   Basophils Relative 0.2 %       Assessment & Plan:   1. Weight loss, unintentional ~4 lbs weight loss in the last month.  Other than weight loss she has no symptoms suggestive of hypothyroid but we will screen this with a TSH, she is well-appearing and hemodynamically stable, no cardiac or pulmonary symptoms.  She is up-to-date on her breast cancer and colon cancer screening Discussed intentional increase of calories and supplementing with boost or Ensure 1 month follow-up Screening labs: - CBC with Differential/Platelet - COMPLETE METABOLIC PANEL WITH GFR - TSH  2. Early satiety Prilosec OTC 20 mg daily for at least 2 weeks She denies abdominal pain nausea vomiting change in bowels -possibly some GERD causing her symptoms - CBC with Differential/Platelet - COMPLETE METABOLIC PANEL WITH GFR   1 month follow-up for weight f/up - encouraged her to come sooner if shes developed any new sx    Delsa Grana, PA-C 06/05/22 9:23 AM

## 2022-06-06 LAB — CBC WITH DIFFERENTIAL/PLATELET
Absolute Monocytes: 394 cells/uL (ref 200–950)
Basophils Absolute: 32 cells/uL (ref 0–200)
Basophils Relative: 0.6 %
Eosinophils Absolute: 103 cells/uL (ref 15–500)
Eosinophils Relative: 1.9 %
HCT: 40.3 % (ref 35.0–45.0)
Hemoglobin: 13.6 g/dL (ref 11.7–15.5)
Lymphs Abs: 1469 cells/uL (ref 850–3900)
MCH: 29.8 pg (ref 27.0–33.0)
MCHC: 33.7 g/dL (ref 32.0–36.0)
MCV: 88.4 fL (ref 80.0–100.0)
MPV: 9.6 fL (ref 7.5–12.5)
Monocytes Relative: 7.3 %
Neutro Abs: 3402 cells/uL (ref 1500–7800)
Neutrophils Relative %: 63 %
Platelets: 277 10*3/uL (ref 140–400)
RBC: 4.56 10*6/uL (ref 3.80–5.10)
RDW: 13.1 % (ref 11.0–15.0)
Total Lymphocyte: 27.2 %
WBC: 5.4 10*3/uL (ref 3.8–10.8)

## 2022-06-06 LAB — COMPLETE METABOLIC PANEL WITH GFR
AG Ratio: 1.6 (calc) (ref 1.0–2.5)
ALT: 16 U/L (ref 6–29)
AST: 20 U/L (ref 10–35)
Albumin: 4.4 g/dL (ref 3.6–5.1)
Alkaline phosphatase (APISO): 44 U/L (ref 37–153)
BUN/Creatinine Ratio: 17 (calc) (ref 6–22)
BUN: 19 mg/dL (ref 7–25)
CO2: 29 mmol/L (ref 20–32)
Calcium: 9.5 mg/dL (ref 8.6–10.4)
Chloride: 106 mmol/L (ref 98–110)
Creat: 1.12 mg/dL — ABNORMAL HIGH (ref 0.50–1.05)
Globulin: 2.7 g/dL (calc) (ref 1.9–3.7)
Glucose, Bld: 83 mg/dL (ref 65–99)
Potassium: 4.5 mmol/L (ref 3.5–5.3)
Sodium: 143 mmol/L (ref 135–146)
Total Bilirubin: 0.6 mg/dL (ref 0.2–1.2)
Total Protein: 7.1 g/dL (ref 6.1–8.1)
eGFR: 56 mL/min/{1.73_m2} — ABNORMAL LOW (ref >=60)

## 2022-06-06 LAB — TSH: TSH: 0.86 mIU/L (ref 0.40–4.50)

## 2022-06-10 ENCOUNTER — Ambulatory Visit: Payer: PRIVATE HEALTH INSURANCE | Admitting: Family Medicine

## 2022-06-19 ENCOUNTER — Other Ambulatory Visit: Payer: Self-pay

## 2022-06-19 DIAGNOSIS — I10 Essential (primary) hypertension: Secondary | ICD-10-CM

## 2022-06-19 MED ORDER — AMLODIPINE BESY-BENAZEPRIL HCL 5-10 MG PO CAPS: 1.0000 | ORAL_CAPSULE | Freq: Every day | ORAL | 3 refills | Status: DC

## 2022-07-22 ENCOUNTER — Ambulatory Visit: Payer: PRIVATE HEALTH INSURANCE

## 2022-07-23 ENCOUNTER — Ambulatory Visit (INDEPENDENT_AMBULATORY_CARE_PROVIDER_SITE_OTHER): Payer: PRIVATE HEALTH INSURANCE

## 2022-07-23 DIAGNOSIS — Z23 Encounter for immunization: Secondary | ICD-10-CM

## 2022-09-24 ENCOUNTER — Ambulatory Visit: Payer: PRIVATE HEALTH INSURANCE | Admitting: Nurse Practitioner

## 2022-09-24 NOTE — Progress Notes (Deleted)
There were no vitals taken for this visit.   Subjective:    Patient ID: Teresa Dodson, female    DOB: [birth year], 60 y.o.   MRN: [id number]  HPI: Teresa Dodson is a 61 y.o. female  No chief complaint on file.  Memory loss:  patient was seen by neurology, Dr. Melrose Nakayama on 04/26/2020 for memory loss. Previous SLUMS score was 12/30.  He ordered a mri of her brain and started her on Aricept 5 mg nightly. MRI showed No acute abnormality. 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 Correlate with risk factors for small vessel disease. She was supposed to follow up with Dr. Melrose Nakayama on 06/28/2020 but she did not. She is here today to discuss ***.  Relevant past medical, surgical, family and social history reviewed and updated as indicated. Interim medical history since our last visit reviewed. Allergies and medications reviewed and updated.  Review of Systems  Constitutional: Negative for fever or weight change.  Respiratory: Negative for cough and shortness of breath.   Cardiovascular: Negative for chest pain or palpitations.  Gastrointestinal: Negative for abdominal pain, no bowel changes.  Musculoskeletal: Negative for gait problem or joint swelling.  Skin: Negative for rash.  Neurological: Negative for dizziness or headache.  No other specific complaints in a complete review of systems (except as listed in HPI above).      Objective:    There were no vitals taken for this visit.  Wt Readings from Last 3 Encounters:  06/05/22 125 lb 6.4 oz (56.9 kg)  02/07/22 130 lb (59 kg)  12/24/21 129 lb 12.8 oz (58.9 kg)    Physical Exam  Constitutional: Patient appears well-developed and well-nourished. Obese *** No distress.  HEENT: head atraumatic, normocephalic, pupils equal and reactive to light, ears ***, neck supple, throat within normal limits Cardiovascular: Normal rate, regular rhythm and normal heart sounds.  No murmur heard. No BLE  edema. Pulmonary/Chest: Effort normal and breath sounds normal. No respiratory distress. Abdominal: Soft.  There is no tenderness. Psychiatric: Patient has a normal mood and affect. behavior is normal. Judgment and thought content normal.   Results for orders placed or performed in visit on 06/05/22  CBC with Differential/Platelet  Result Value Ref Range   WBC 5.4 3.8 - 10.8 Thousand/uL   RBC 4.56 3.80 - 5.10 Million/uL   Hemoglobin 13.6 11.7 - 15.5 g/dL   HCT 40.3 35.0 - 45.0 %   MCV 88.4 80.0 - 100.0 fL   MCH 29.8 27.0 - 33.0 pg   MCHC 33.7 32.0 - 36.0 g/dL   RDW 13.1 11.0 - 15.0 %   Platelets 277 140 - 400 Thousand/uL   MPV 9.6 7.5 - 12.5 fL   Neutro Abs 3,402 1,500 - 7,800 cells/uL   Lymphs Abs 1,469 850 - 3,900 cells/uL   Absolute Monocytes 394 200 - 950 cells/uL   Eosinophils Absolute 103 15 - 500 cells/uL   Basophils Absolute 32 0 - 200 cells/uL   Neutrophils Relative % 63 %   Total Lymphocyte 27.2 %   Monocytes Relative 7.3 %   Eosinophils Relative 1.9 %   Basophils Relative 0.6 %  COMPLETE METABOLIC PANEL WITH GFR  Result Value Ref Range   Glucose, Bld 83 65 - 99 mg/dL   BUN 19 7 - 25 mg/dL   Creat 1.12 (H) 0.50 - 1.05 mg/dL   eGFR 56 (L) > OR = 60 mL/min/1.36m   BUN/Creatinine Ratio 17 6 - 22 (calc)   Sodium  143 135 - 146 mmol/L   Potassium 4.5 3.5 - 5.3 mmol/L   Chloride 106 98 - 110 mmol/L   CO2 29 20 - 32 mmol/L   Calcium 9.5 8.6 - 10.4 mg/dL   Total Protein 7.1 6.1 - 8.1 g/dL   Albumin 4.4 3.6 - 5.1 g/dL   Globulin 2.7 1.9 - 3.7 g/dL (calc)   AG Ratio 1.6 1.0 - 2.5 (calc)   Total Bilirubin 0.6 0.2 - 1.2 mg/dL   Alkaline phosphatase (APISO) 44 37 - 153 U/L   AST 20 10 - 35 U/L   ALT 16 6 - 29 U/L  TSH  Result Value Ref Range   TSH 0.86 0.40 - 4.50 mIU/L      Assessment & Plan:   Problem List Items Addressed This Visit   None    Follow up plan: No follow-ups on file.

## 2022-09-24 NOTE — Progress Notes (Deleted)
There were no vitals taken for this visit.   Subjective:    Patient ID: Teresa Dodson, female    DOB: Aug 18, 1961, 61 y.o.   MRN: [id number]  HPI: Teresa Dodson is a 61 y.o. female  No chief complaint on file.  Memory loss:  patient was seen by neurology, Dr. Melrose Nakayama on 04/26/2020 for memory loss. Previous SLUMS score was 12/30.  He ordered a mri of her brain and started her on Aricept 5 mg nightly. MRI showed No acute abnormality. 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 Correlate with risk factors for small vessel disease. She was supposed to follow up with Dr. Melrose Nakayama on 06/28/2020 but she did not. She is here today to discuss ***.  Relevant past medical, surgical, family and social history reviewed and updated as indicated. Interim medical history since our last visit reviewed. Allergies and medications reviewed and updated.  Review of Systems  Constitutional: Negative for fever or weight change.  Respiratory: Negative for cough and shortness of breath.   Cardiovascular: Negative for chest pain or palpitations.  Gastrointestinal: Negative for abdominal pain, no bowel changes.  Musculoskeletal: Negative for gait problem or joint swelling.  Skin: Negative for rash.  Neurological: Negative for dizziness or headache.  No other specific complaints in a complete review of systems (except as listed in HPI above).      Objective:    There were no vitals taken for this visit.  Wt Readings from Last 3 Encounters:  06/05/22 125 lb 6.4 oz (56.9 kg)  02/07/22 130 lb (59 kg)  12/24/21 129 lb 12.8 oz (58.9 kg)    Physical Exam  Constitutional: Patient appears well-developed and well-nourished. Obese *** No distress.  HEENT: head atraumatic, normocephalic, pupils equal and reactive to light, ears ***, neck supple, throat within normal limits Cardiovascular: Normal rate, regular rhythm and normal heart sounds.  No murmur heard. No BLE  edema. Pulmonary/Chest: Effort normal and breath sounds normal. No respiratory distress. Abdominal: Soft.  There is no tenderness. Psychiatric: Patient has a normal mood and affect. behavior is normal. Judgment and thought content normal.   Results for orders placed or performed in visit on 06/05/22  CBC with Differential/Platelet  Result Value Ref Range   WBC 5.4 3.8 - 10.8 Thousand/uL   RBC 4.56 3.80 - 5.10 Million/uL   Hemoglobin 13.6 11.7 - 15.5 g/dL   HCT 40.3 35.0 - 45.0 %   MCV 88.4 80.0 - 100.0 fL   MCH 29.8 27.0 - 33.0 pg   MCHC 33.7 32.0 - 36.0 g/dL   RDW 13.1 11.0 - 15.0 %   Platelets 277 140 - 400 Thousand/uL   MPV 9.6 7.5 - 12.5 fL   Neutro Abs 3,402 1,500 - 7,800 cells/uL   Lymphs Abs 1,469 850 - 3,900 cells/uL   Absolute Monocytes 394 200 - 950 cells/uL   Eosinophils Absolute 103 15 - 500 cells/uL   Basophils Absolute 32 0 - 200 cells/uL   Neutrophils Relative % 63 %   Total Lymphocyte 27.2 %   Monocytes Relative 7.3 %   Eosinophils Relative 1.9 %   Basophils Relative 0.6 %  COMPLETE METABOLIC PANEL WITH GFR  Result Value Ref Range   Glucose, Bld 83 65 - 99 mg/dL   BUN 19 7 - 25 mg/dL   Creat 1.12 (H) 0.50 - 1.05 mg/dL   eGFR 56 (L) > OR = 60 mL/min/1.64m   BUN/Creatinine Ratio 17 6 - 22 (calc)   Sodium  143 135 - 146 mmol/L   Potassium 4.5 3.5 - 5.3 mmol/L   Chloride 106 98 - 110 mmol/L   CO2 29 20 - 32 mmol/L   Calcium 9.5 8.6 - 10.4 mg/dL   Total Protein 7.1 6.1 - 8.1 g/dL   Albumin 4.4 3.6 - 5.1 g/dL   Globulin 2.7 1.9 - 3.7 g/dL (calc)   AG Ratio 1.6 1.0 - 2.5 (calc)   Total Bilirubin 0.6 0.2 - 1.2 mg/dL   Alkaline phosphatase (APISO) 44 37 - 153 U/L   AST 20 10 - 35 U/L   ALT 16 6 - 29 U/L  TSH  Result Value Ref Range   TSH 0.86 0.40 - 4.50 mIU/L      Assessment & Plan:   Problem List Items Addressed This Visit   None    Follow up plan: No follow-ups on file.

## 2022-09-25 ENCOUNTER — Ambulatory Visit: Payer: PRIVATE HEALTH INSURANCE | Admitting: Nurse Practitioner

## 2022-12-10 ENCOUNTER — Ambulatory Visit (INDEPENDENT_AMBULATORY_CARE_PROVIDER_SITE_OTHER): Payer: 59 | Admitting: Family Medicine

## 2022-12-10 ENCOUNTER — Encounter: Payer: Self-pay | Admitting: Family Medicine

## 2022-12-10 VITALS — BP 122/76 | HR 70 | Temp 98.0°F | Resp 16 | Ht 67.0 in | Wt 126.0 lb

## 2022-12-10 DIAGNOSIS — E785 Hyperlipidemia, unspecified: Secondary | ICD-10-CM

## 2022-12-10 DIAGNOSIS — N1831 Chronic kidney disease, stage 3a: Secondary | ICD-10-CM | POA: Insufficient documentation

## 2022-12-10 DIAGNOSIS — Z1231 Encounter for screening mammogram for malignant neoplasm of breast: Secondary | ICD-10-CM

## 2022-12-10 DIAGNOSIS — Z5181 Encounter for therapeutic drug level monitoring: Secondary | ICD-10-CM

## 2022-12-10 DIAGNOSIS — R413 Other amnesia: Secondary | ICD-10-CM

## 2022-12-10 DIAGNOSIS — R634 Abnormal weight loss: Secondary | ICD-10-CM | POA: Diagnosis not present

## 2022-12-10 DIAGNOSIS — Z820 Family history of epilepsy and other diseases of the nervous system: Secondary | ICD-10-CM | POA: Diagnosis not present

## 2022-12-10 DIAGNOSIS — I10 Essential (primary) hypertension: Secondary | ICD-10-CM

## 2022-12-10 HISTORY — DX: Chronic kidney disease, stage 3a: N18.31

## 2022-12-10 MED ORDER — DONEPEZIL HCL 5 MG PO TABS: 5.0000 mg | ORAL_TABLET | Freq: Every day | ORAL | 0 refills | Status: DC

## 2022-12-10 NOTE — Assessment & Plan Note (Signed)
Elevated LDL did improve last year, recheck labs, patient currently not on medications

## 2022-12-10 NOTE — Assessment & Plan Note (Addendum)
BP at goal today, pt endorses good med compliance, maintain on amlodipine-benazepril We have been monitoring her renal function Continue med w/o dose changes and DASH BP Readings from Last 3 Encounters:  12/10/22 122/76  06/05/22 116/70  02/07/22 119/86

## 2022-12-10 NOTE — Progress Notes (Signed)
Patient ID: Teresa Dodson, female    DOB: [birth year], 62 y.o.   MRN: [id number]  PCP: Delsa Grana, PA-C  Chief Complaint  Patient presents with   Memory Loss    Onset for months    Subjective:   Teresa Dodson is a 62 y.o. female, presents to clinic with CC of the following:  HPI  PT presents for concerns of memory loss/deficit.  Prior OV and referrals for the same in 2021 with subsequent referral to neurology for further eval and testing.  Pertinent family hx of alzheimer's   Referral to neurology done in April and June 2021 with neurology OV in 2021 at [facility]. Last in person neurology OV she was started on aricept 5 mg qhs, MRI brain scheduled after that with 2 month neurology f/up which was not done.   She states today that she tried the medicine once or twice and had SE and did not continue it Labs from that [facility] and MRI were normal/unremarkable and she was lost to f/up  She is having more short term memory problems, losing things that she was just using which causes her distress.  Prior consult with Dr. Melrose Nakayama they did address anxiety -they were advised by the specialist that anxiety about short-term memory issues can cause worse symptoms.  Patient has not previously been treated for anxiety or seeing a therapist or psychologist They have been working on physical activity and puzzles daily She has not gotten lost/disoriented or forgotten names of people she knows Her husband here today states she does not had good diet, but she is physically active  MMSE done today, none done previously to compare to - 6cig was done in 2021 when she presented then for a referral    12/10/2022    1:37 PM  MMSE - Mini Mental State Exam  Orientation to time 2  Orientation to Place 3  Registration 3  Attention/ Calculation 5  Recall 0  Language- name 2 objects 2  Language- repeat 1  Language- follow 3 step command 3  Language- read & follow direction 1  Write a sentence 1  Copy  design 1  Total score 22     6CIT:   02/24/20 1422  6 CIT Score  What year is it? 0 points  What month is it? 0 points  Give patient an address phrase to remember (5 components) "no if's and's or buts"  About what time is it? 3 points  Count backwards from 20 to 1 0 points  Say the months of the year in reverse 2 points  Repeat the address phrase from earlier 10 points  6 CIT Score 15 points    Serial 7's -only able to calculate serial sevens from 100 x 2 WORLD backwards - good   Some weight loss - she forgets to eat or just "doesn't want to eat" Husband states she got down to 121 but he has been making sure she eats better, cut out some junk food Wt Readings from Last 10 Encounters:  12/10/22 126 lb (57.2 kg)  06/05/22 125 lb 6.4 oz (56.9 kg)  02/07/22 130 lb (59 kg)  12/24/21 129 lb 12.8 oz (58.9 kg)  06/20/21 133 lb 11.2 oz (60.6 kg)  01/05/21 141 lb 1.6 oz (64 kg)  11/25/20 130 lb (59 kg)  08/27/20 130 lb (59 kg)  08/24/20 140 lb (63.5 kg)  05/25/20 140 lb 12.8 oz (63.9 kg)   BMI Readings from Last 5 Encounters:  12/10/22 19.73 kg/m  06/05/22 19.64 kg/m  02/07/22 20.06 kg/m  12/24/21 19.74 kg/m  06/20/21 20.33 kg/m   HTN - on amlodipine benazepril she reports good compliance, blood pressure is controlled today BP Readings from Last 3 Encounters:  12/10/22 122/76  06/05/22 116/70  02/07/22 119/86   CKD stage 3a  HLD - last LDL improved with diet/lifestyle efforts, not on meds   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   History of nephrolithiasis 06/22/2015   Hypertension, goal below 140/90 06/22/2015   Allergic rhinitis with postnasal drip 06/22/2015   Foot pain,  bilateral 06/22/2015   History of Bell's palsy 09/14/2014   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 (Patient not taking: Reported on 12/10/2022), Disp: 30 capsule, Rfl: 3   Allergies  Allergen Reactions   Sulfamethoxazole-Trimethoprim Rash     Social History   Tobacco Use   Smoking status: Never   Smokeless tobacco: Never  Vaping Use   Vaping Use: Never used  Substance Use Topics   Alcohol use: Yes    Alcohol/week: 2.0 standard drinks of alcohol    Types: 2 Glasses of wine per week    Comment: ocassionally   Drug use: No      Chart Review Today: I personally reviewed active problem list, medication list, allergies, family history, social history, health maintenance, notes from last encounter, lab results, imaging with the patient/caregiver today.   Review of Systems  Constitutional: Negative.   HENT: Negative.    Eyes: Negative.   Respiratory: Negative.    Cardiovascular: Negative.   Gastrointestinal: Negative.   Endocrine: Negative.   Genitourinary: Negative.   Musculoskeletal: Negative.   Skin: Negative.   Allergic/Immunologic: Negative.   Neurological: Negative.   Hematological: Negative.   Psychiatric/Behavioral: Negative.    All other systems reviewed and are negative.      Objective:   Vitals:   12/10/22 1340  BP: 122/76  Pulse: 70  Resp: 16  Temp: 98 F (36.7 C)  TempSrc: Oral  SpO2: 100%  Weight: 126 lb (57.2 kg)  Height: '5\' 7"'$  (1.702 m)    Body mass index is 19.73 kg/m.  Physical Exam Vitals and nursing note reviewed.   Constitutional:      General: She is not in acute distress.    Appearance: Normal appearance. She is well-developed. She is not ill-appearing, toxic-appearing or diaphoretic.  HENT:     Head: Normocephalic and atraumatic.     Right Ear: External ear normal.     Left Ear: External ear normal.     Nose: Nose normal.  Eyes:     General: No scleral icterus.       Right eye: No discharge.        Left eye: No discharge.     Conjunctiva/sclera: Conjunctivae normal.  Neck:  Trachea: No tracheal deviation.  Cardiovascular:     Rate and Rhythm: Normal rate and regular rhythm.     Pulses: Normal pulses.     Heart sounds: Normal heart sounds. No murmur heard.    No friction rub. No gallop.  Pulmonary:     Effort: Pulmonary effort is normal. No respiratory distress.     Breath sounds: Normal breath sounds. No stridor. No wheezing, rhonchi or rales.  Chest:     Chest wall: No tenderness.  Abdominal:     General: Bowel sounds are normal.     Palpations: Abdomen is soft.  Musculoskeletal:     Cervical back: Normal range of motion.     Right lower leg: No edema.     Left lower leg: No edema.  Lymphadenopathy:     Cervical: No cervical adenopathy.  Skin:    General: Skin is warm and dry.     Findings: No rash.  Neurological:     Mental Status: She is alert and oriented to person, place, and time. Mental status is at baseline.     Motor: No weakness or abnormal muscle tone.     Coordination: Coordination normal.     Gait: Gait normal.  Psychiatric:        Attention and Perception: Attention and perception normal.        Mood and Affect: Mood and affect normal.        Speech: Speech normal.        Behavior: Behavior normal. Behavior is cooperative.      Results for orders placed or performed in visit on 06/05/22  CBC with Differential/Platelet  Result Value Ref Range   WBC 5.4 3.8 - 10.8 Thousand/uL   RBC 4.56 3.80 - 5.10 Million/uL   Hemoglobin 13.6 11.7 - 15.5 g/dL   HCT  40.3 35.0 - 45.0 %   MCV 88.4 80.0 - 100.0 fL   MCH 29.8 27.0 - 33.0 pg   MCHC 33.7 32.0 - 36.0 g/dL   RDW 13.1 11.0 - 15.0 %   Platelets 277 140 - 400 Thousand/uL   MPV 9.6 7.5 - 12.5 fL   Neutro Abs 3,402 1,500 - 7,800 cells/uL   Lymphs Abs 1,469 850 - 3,900 cells/uL   Absolute Monocytes 394 200 - 950 cells/uL   Eosinophils Absolute 103 15 - 500 cells/uL   Basophils Absolute 32 0 - 200 cells/uL   Neutrophils Relative % 63 %   Total Lymphocyte 27.2 %   Monocytes Relative 7.3 %   Eosinophils Relative 1.9 %   Basophils Relative 0.6 %  COMPLETE METABOLIC PANEL WITH GFR  Result Value Ref Range   Glucose, Bld 83 65 - 99 mg/dL   BUN 19 7 - 25 mg/dL   Creat 1.12 (H) 0.50 - 1.05 mg/dL   eGFR 56 (L) > OR = 60 mL/min/1.8m   BUN/Creatinine Ratio 17 6 - 22 (calc)   Sodium 143 135 - 146 mmol/L   Potassium 4.5 3.5 - 5.3 mmol/L   Chloride 106 98 - 110 mmol/L   CO2 29 20 - 32 mmol/L   Calcium 9.5 8.6 - 10.4 mg/dL   Total Protein 7.1 6.1 - 8.1 g/dL   Albumin 4.4 3.6 - 5.1 g/dL   Globulin 2.7 1.9 - 3.7 g/dL (calc)   AG Ratio 1.6 1.0 - 2.5 (calc)   Total Bilirubin 0.6 0.2 - 1.2 mg/dL   Alkaline phosphatase (APISO) 44 37 - 153 U/L   AST 20 10 -  35 U/L   ALT 16 6 - 29 U/L  TSH  Result Value Ref Range   TSH 0.86 0.40 - 4.50 mIU/L       Assessment & Plan:   Problem List Items Addressed This Visit       Cardiovascular and Mediastinum   Primary hypertension    BP at goal today, pt endorses good med compliance, maintain on amlodipine-benazepril We have been monitoring her renal function Continue med w/o dose changes and DASH BP Readings from Last 3 Encounters:  12/10/22 122/76  06/05/22 116/70  02/07/22 119/86         Genitourinary   Stage 3a chronic kidney disease ([place])    Has been stable for the past 1-2 years, eGFR 35's Reviewed CKD stage 3a with the pt and her husband, encouraged continue HTN control, adequate nutrition/hydration      Relevant Orders   COMPLETE  METABOLIC PANEL WITH GFR     Other   Family history of Alzheimer's disease   Relevant Orders   TSH   Vitamin B12   Vitamin B1   Ambulatory referral to Neurology   Hyperlipidemia    Elevated LDL did improve last year, recheck labs, patient currently not on medications      Relevant Orders   COMPLETE METABOLIC PANEL WITH GFR   Lipid panel   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  Return to neurology Counseling for anxiety or finding memory specialists/memory care may be helpful Discussed physical activity, puzzles, adequate nutrition, setting reminders when forgetting things/eating Restart aricept per prior neurology dosing - explained purpose of medication, possible SE and encouraged her to take regularly for several weeks to months and to f/up with neurology for continued work up and management Referral ordered again and pt encouraged to contact [facility] to get their follow-up appointment Husband with pt today providing 10-15% of the history He did ask that we recheck some of the labs done by neurology - labs ordered      Relevant Medications   donepezil (ARICEPT) 5 MG tablet   Other Relevant Orders   TSH   Vitamin B12   Vitamin B1   Ambulatory referral to Neurology   Other Visit Diagnoses     Weight loss, unintentional       weight stable over the past couple months per our VS - encouraged her to set reminders for meals, emphasize protein/fiber, can add ensure/boost   Relevant Orders   CBC with Differential/Platelet   COMPLETE METABOLIC PANEL WITH GFR   TSH   Vitamin B12   Vitamin B1   Encounter for medication monitoring       Relevant Orders   CBC with Differential/Platelet   COMPLETE METABOLIC PANEL WITH GFR   Lipid panel   TSH   Vitamin B12   Vitamin B1   Encounter for screening mammogram for malignant neoplasm of breast       Relevant Orders   MM 3D SCREEN BREAST W/IMPLANT BILATERAL       Health Maintenance  Topic Date Due    Mammogram  06/28/2022   COVID-19 Vaccine (4 - 2023-24 season) 07/05/2022   Zoster (Shingles) Vaccine (2 of 2) 03/10/2023*   Cologuard (Stool DNA test)  01/08/2025   DTaP/Tdap/Td vaccine (2 - Td or Tdap) 03/04/2027   Flu Shot  Completed   Hepatitis C Screening: USPSTF Recommendation to screen - Ages 18-79 yo.  Completed   HIV Screening  Completed  HPV Vaccine  Aged Out   Pap Smear  Discontinued  *Topic was postponed. The date shown is not the original due date.   HM reviewed - mammo order entered    Delsa Grana, PA-C 12/10/22 1:56 PM

## 2022-12-10 NOTE — Assessment & Plan Note (Signed)
Has been stable for the past 1-2 years, eGFR 50's Reviewed CKD stage 3a with the pt and her husband, encouraged continue HTN control, adequate nutrition/hydration

## 2022-12-10 NOTE — Assessment & Plan Note (Addendum)
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  Return to neurology Counseling for anxiety or finding memory specialists/memory care may be helpful Discussed physical activity, puzzles, adequate nutrition, setting reminders when forgetting things/eating Restart aricept per prior neurology dosing - explained purpose of medication, possible SE and encouraged her to take regularly for several weeks to months and to f/up with neurology for continued work up and management Referral ordered again and pt encouraged to contact [facility] to get their follow-up appointment Husband with pt today providing 10-15% of the history He did ask that we recheck some of the labs done by neurology - labs ordered

## 2022-12-16 LAB — CBC WITH DIFFERENTIAL/PLATELET
Absolute Monocytes: 311 cells/uL (ref 200–950)
Basophils Absolute: 20 cells/uL (ref 0–200)
Basophils Relative: 0.4 %
Eosinophils Absolute: 41 cells/uL (ref 15–500)
Eosinophils Relative: 0.8 %
HCT: 40.9 % (ref 35.0–45.0)
Hemoglobin: 13.9 g/dL (ref 11.7–15.5)
Lymphs Abs: 1489 cells/uL (ref 850–3900)
MCH: 29.9 pg (ref 27.0–33.0)
MCHC: 34 g/dL (ref 32.0–36.0)
MCV: 88 fL (ref 80.0–100.0)
MPV: 10.1 fL (ref 7.5–12.5)
Monocytes Relative: 6.1 %
Neutro Abs: 3239 cells/uL (ref 1500–7800)
Neutrophils Relative %: 63.5 %
Platelets: 227 10*3/uL (ref 140–400)
RBC: 4.65 10*6/uL (ref 3.80–5.10)
RDW: 12.9 % (ref 11.0–15.0)
Total Lymphocyte: 29.2 %
WBC: 5.1 10*3/uL (ref 3.8–10.8)

## 2022-12-16 LAB — COMPLETE METABOLIC PANEL WITH GFR
AG Ratio: 1.8 (calc) (ref 1.0–2.5)
ALT: 14 U/L (ref 6–29)
AST: 23 U/L (ref 10–35)
Albumin: 4.6 g/dL (ref 3.6–5.1)
Alkaline phosphatase (APISO): 48 U/L (ref 37–153)
BUN: 19 mg/dL (ref 7–25)
CO2: 26 mmol/L (ref 20–32)
Calcium: 9.4 mg/dL (ref 8.6–10.4)
Chloride: 107 mmol/L (ref 98–110)
Creat: 1.05 mg/dL (ref 0.50–1.05)
Globulin: 2.6 g/dL (calc) (ref 1.9–3.7)
Glucose, Bld: 86 mg/dL (ref 65–99)
Potassium: 4 mmol/L (ref 3.5–5.3)
Sodium: 144 mmol/L (ref 135–146)
Total Bilirubin: 0.5 mg/dL (ref 0.2–1.2)
Total Protein: 7.2 g/dL (ref 6.1–8.1)
eGFR: 60 mL/min/{1.73_m2} (ref >=60)

## 2022-12-16 LAB — LIPID PANEL
Cholesterol: 245 mg/dL — ABNORMAL HIGH (ref <200)
HDL: 97 mg/dL (ref >=50)
LDL Cholesterol (Calc): 123 mg/dL (calc) — ABNORMAL HIGH
Non-HDL Cholesterol (Calc): 148 mg/dL (calc) — ABNORMAL HIGH (ref <130)
Total CHOL/HDL Ratio: 2.5 (calc) (ref <5.0)
Triglycerides: 139 mg/dL (ref <150)

## 2022-12-16 LAB — TSH: TSH: 0.83 mIU/L (ref 0.40–4.50)

## 2022-12-16 LAB — VITAMIN B12: Vitamin B-12: 501 pg/mL (ref 200–1100)

## 2022-12-16 LAB — VITAMIN B1: Vitamin B1 (Thiamine): 18 nmol/L (ref 8–30)

## 2023-03-20 ENCOUNTER — Ambulatory Visit
Admission: RE | Admit: 2023-03-20 | Discharge: 2023-03-20 | Disposition: A | Discharge status: Home / Self Care | Payer: 59 | Source: Ambulatory Visit | Attending: Family Medicine | Admitting: Family Medicine

## 2023-03-20 DIAGNOSIS — Z1231 Encounter for screening mammogram for malignant neoplasm of breast: Secondary | ICD-10-CM | POA: Diagnosis not present

## 2023-04-07 ENCOUNTER — Other Ambulatory Visit: Payer: Self-pay | Admitting: Family Medicine

## 2023-04-07 DIAGNOSIS — R413 Other amnesia: Secondary | ICD-10-CM

## 2023-05-05 ENCOUNTER — Other Ambulatory Visit: Payer: Self-pay | Admitting: Family Medicine

## 2023-05-05 DIAGNOSIS — R413 Other amnesia: Secondary | ICD-10-CM

## 2023-06-02 ENCOUNTER — Other Ambulatory Visit: Payer: Self-pay | Admitting: Family Medicine

## 2023-06-02 DIAGNOSIS — R413 Other amnesia: Secondary | ICD-10-CM

## 2023-06-10 ENCOUNTER — Encounter: Payer: Self-pay | Admitting: Family Medicine

## 2023-06-10 ENCOUNTER — Ambulatory Visit (INDEPENDENT_AMBULATORY_CARE_PROVIDER_SITE_OTHER): Payer: 59 | Admitting: Family Medicine

## 2023-06-10 VITALS — BP 146/84 | HR 77 | Temp 98.3°F | Resp 16 | Ht 67.0 in | Wt 127.5 lb

## 2023-06-10 DIAGNOSIS — Z5181 Encounter for therapeutic drug level monitoring: Secondary | ICD-10-CM | POA: Diagnosis not present

## 2023-06-10 DIAGNOSIS — N1831 Chronic kidney disease, stage 3a: Secondary | ICD-10-CM

## 2023-06-10 DIAGNOSIS — F430 Acute stress reaction: Secondary | ICD-10-CM | POA: Diagnosis not present

## 2023-06-10 DIAGNOSIS — I10 Essential (primary) hypertension: Secondary | ICD-10-CM | POA: Diagnosis not present

## 2023-06-10 DIAGNOSIS — Z820 Family history of epilepsy and other diseases of the nervous system: Secondary | ICD-10-CM | POA: Diagnosis not present

## 2023-06-10 DIAGNOSIS — R6881 Early satiety: Secondary | ICD-10-CM | POA: Diagnosis not present

## 2023-06-10 DIAGNOSIS — E785 Hyperlipidemia, unspecified: Secondary | ICD-10-CM

## 2023-06-10 DIAGNOSIS — R413 Other amnesia: Secondary | ICD-10-CM | POA: Diagnosis not present

## 2023-06-10 MED ORDER — OMEPRAZOLE 20 MG PO CPDR
20.0000 mg | DELAYED_RELEASE_CAPSULE | Freq: Every day | ORAL | 5 refills | Status: DC | PRN
Start: 2023-06-10 — End: 2023-07-22

## 2023-06-10 MED ORDER — DONEPEZIL HCL 5 MG PO TABS: 5.0000 mg | ORAL_TABLET | Freq: Every day | ORAL | 5 refills | Status: DC

## 2023-06-10 MED ORDER — AMLODIPINE BESY-BENAZEPRIL HCL 5-10 MG PO CAPS: 1.0000 | ORAL_CAPSULE | Freq: Every day | ORAL | 3 refills | Status: DC

## 2023-06-10 NOTE — Progress Notes (Unsigned)
Name: Teresa Dodson   MRN: [id number]    DOB: [birth year]   Date:06/10/2023       Progress Note  Chief Complaint  Patient presents with   Follow-up   Hypertension   Hyperlipidemia     Subjective:   Teresa Dodson is a 62 y.o. female, presents to clinic for routine f/up]  Hypertension:  Currently managed on *** Pt reports *** med compliance and denies any SE.   Blood pressure today is *** controlled. BP Readings from Last 3 Encounters:  06/10/23 (!) 146/80  12/10/22 122/76  06/05/22 116/70   Pt denies CP, SOB, exertional sx, LE edema, palpitation, Ha's, visual disturbances, lightheadedness, hypotension, syncope. Dietary efforts for BP?  ***   She has seen neuro for memory issues/MCI? On aricept   ***   Current Outpatient Medications:    amLODipine-benazepril (LOTREL) 5-10 MG capsule, Take 1 capsule by mouth daily., Disp: 90 capsule, Rfl: 3   cholecalciferol (VITAMIN D) 1000 units tablet, Take 1,000 Units by mouth daily., Disp: , Rfl:    donepezil (ARICEPT) 5 MG tablet, TAKE 1 TABLET BY MOUTH EVERYDAY AT BEDTIME, Disp: 30 tablet, Rfl: 0   Multiple Vitamins-Minerals (MULTIPLE VITAMINS/WOMENS PO), Take by mouth., Disp: , Rfl:    omeprazole (PRILOSEC) 20 MG capsule, Take 1 capsule (20 mg total) by mouth daily. 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    Past Surgical History:  Procedure Laterality Date   ABDOMINAL HYSTERECTOMY     vaginal; partial   AUGMENTATION MAMMAPLASTY     COLONOSCOPY WITH PROPOFOL N/A  02/07/2022   Procedure: COLONOSCOPY WITH PROPOFOL;  Surgeon: Wyline Mood, MD;  Location: [facility] ENDOSCOPY;  Service: Gastroenterology;  Laterality: N/A;    Family History  Problem Relation Age of Onset   Alzheimer's disease Mother    Cancer Father        lung   Breast cancer Paternal Aunt    Diabetes Paternal Grandmother     Social History   Tobacco Use   Smoking status: Never   Smokeless tobacco: Never  Vaping Use   Vaping status: Never Used  Substance Use Topics   Alcohol use: Yes    Alcohol/week: 2.0 standard drinks of alcohol    Types: 2 Glasses of wine per week    Comment: ocassionally   Drug use: No     Allergies  Allergen Reactions   Sulfamethoxazole-Trimethoprim Rash    Health Maintenance  Topic Date Due   INFLUENZA VACCINE  06/05/2023   COVID-19 Vaccine (4 - 2023-24 season) 06/26/2023 (Originally 07/05/2022)   Zoster Vaccines- Shingrix (2 of 2) 09/10/2023 (Originally 09/17/2022)   MAMMOGRAM  03/19/2024   Fecal DNA (Cologuard)  01/08/2025   DTaP/Tdap/Td (2 - Td or Tdap) 03/04/2027   Hepatitis C Screening  Completed   HIV Screening  Completed   HPV VACCINES  Aged Out   PAP SMEAR-Modifier  Discontinued    Chart Review Today: ***  Review of Systems   Objective:   Vitals:   06/10/23 1336 06/10/23 1346  BP: (!) 150/84 (!) 146/80  Pulse: 77   Resp: 16   Temp: 98.3 F (36.8 C)   TempSrc: Oral   SpO2: 100%   Weight: 127 lb 8 oz (57.8 kg)   Height: 5\' 7"  (1.702 m)     Body mass index is 19.97 kg/m.  Physical Exam      Assessment & Plan:   Problem List Items Addressed This Visit       Cardiovascular and Mediastinum   Primary hypertension - Primary   Relevant Orders   BASIC METABOLIC PANEL WITH GFR     Genitourinary   Stage 3a chronic kidney disease (HCC)   Relevant Orders   BASIC METABOLIC PANEL WITH GFR     Other   Hyperlipidemia   Other Visit Diagnoses     Encounter for medication monitoring       Relevant Orders   BASIC  METABOLIC PANEL WITH GFR        No follow-ups on file.   Danelle Berry, PA-C 06/10/23 1:47 PM

## 2023-06-12 ENCOUNTER — Encounter: Payer: Self-pay | Admitting: Family Medicine

## 2023-06-12 NOTE — Assessment & Plan Note (Signed)
She needs to est with new neurologist covered by her insurance. Previously did a consult with [facility] (2021) - see prior OV with last referrals - she is on aricept, very easy to be more forgetful with leaving her home, increased stress, pt and her husband are concerned and would like to get est again with neuro for further eval and management

## 2023-06-12 NOTE — Assessment & Plan Note (Signed)
BP elevated today, pt has multiple recent stressors She is compliant with amlodipine benazepril 5-10 BP Readings from Last 3 Encounters:  06/10/23 (!) 146/84  12/10/22 122/76  06/05/22 116/70  BP was repeated here today but still a little elevated She will monitor BP at home when able to relax and see if it returns to normal F/up in the next 2-4 weeks with BP readings Plan to increase med dose if BP remains on average >130/80

## 2023-06-12 NOTE — Assessment & Plan Note (Signed)
Recheck BP

## 2023-06-12 NOTE — Assessment & Plan Note (Signed)
Not on meds, last lipids and ASCVD (with her previously well controlled BP) show she was lower risk therefor not on statin Will recheck at her next CPE/appt Lab Results  Component Value Date   CHOL 245 (H) 12/10/2022   HDL 97 12/10/2022   LDLCALC 123 (H) 12/10/2022   TRIG 139 12/10/2022   CHOLHDL 2.5 12/10/2022

## 2023-06-27 ENCOUNTER — Encounter: Payer: Self-pay | Admitting: Physician Assistant

## 2023-07-22 ENCOUNTER — Other Ambulatory Visit (INDEPENDENT_AMBULATORY_CARE_PROVIDER_SITE_OTHER): Payer: 59

## 2023-07-22 ENCOUNTER — Ambulatory Visit: Payer: PRIVATE HEALTH INSURANCE

## 2023-07-22 ENCOUNTER — Encounter: Payer: Self-pay | Admitting: Physician Assistant

## 2023-07-22 ENCOUNTER — Ambulatory Visit: Payer: 59 | Admitting: Physician Assistant

## 2023-07-22 VITALS — BP 153/85 | HR 104 | Resp 20 | Ht 67.0 in | Wt 129.0 lb

## 2023-07-22 DIAGNOSIS — R413 Other amnesia: Secondary | ICD-10-CM

## 2023-07-22 DIAGNOSIS — Z820 Family history of epilepsy and other diseases of the nervous system: Secondary | ICD-10-CM | POA: Diagnosis not present

## 2023-07-22 LAB — COMPREHENSIVE METABOLIC PANEL
ALT: 17 U/L (ref 0–35)
AST: 22 U/L (ref 0–37)
Albumin: 4.6 g/dL (ref 3.5–5.2)
Alkaline Phosphatase: 50 U/L (ref 39–117)
BUN: 16 mg/dL (ref 6–23)
CO2: 31 meq/L (ref 19–32)
Calcium: 9.5 mg/dL (ref 8.4–10.5)
Chloride: 102 meq/L (ref 96–112)
Creatinine, Ser: 1.02 mg/dL (ref 0.40–1.20)
GFR: 58.78 mL/min — ABNORMAL LOW (ref >60.00)
Glucose, Bld: 83 mg/dL (ref 70–99)
Potassium: 3.8 meq/L (ref 3.5–5.1)
Sodium: 141 meq/L (ref 135–145)
Total Bilirubin: 0.7 mg/dL (ref 0.2–1.2)
Total Protein: 7.3 g/dL (ref 6.0–8.3)

## 2023-07-22 LAB — AMMONIA: Ammonia: 22 umol/L (ref 11–35)

## 2023-07-22 LAB — SEDIMENTATION RATE: Sed Rate: 4 mm/h (ref 0–30)

## 2023-07-22 LAB — CBC
HCT: 44.1 % (ref 36.0–46.0)
Hemoglobin: 14.1 g/dL (ref 12.0–15.0)
MCHC: 31.9 g/dL (ref 30.0–36.0)
MCV: 93.3 fl (ref 78.0–100.0)
Platelets: 229 10*3/uL (ref 150.0–400.0)
RBC: 4.72 Mil/uL (ref 3.87–5.11)
RDW: 13.4 % (ref 11.5–15.5)
WBC: 5.2 10*3/uL (ref 4.0–10.5)

## 2023-07-22 LAB — C-REACTIVE PROTEIN: CRP: <1 mg/dL (ref 0.5–20.0)

## 2023-07-22 LAB — TSH: TSH: 1.52 u[IU]/mL (ref 0.35–5.50)

## 2023-07-22 LAB — VITAMIN B12: Vitamin B-12: 396 pg/mL (ref 211–911)

## 2023-07-22 MED ORDER — DONEPEZIL HCL 10 MG PO TABS
10.0000 mg | ORAL_TABLET | Freq: Every day | ORAL | 11 refills | Status: DC
Start: 2023-07-22 — End: 2024-04-08

## 2023-07-22 NOTE — Patient Instructions (Addendum)

## 2023-07-22 NOTE — Progress Notes (Signed)
Assessment/Plan:   Teresa Dodson is a very pleasant 62 y.o. year old RH female with a history of hypertension, hyperlipidemia, CKD stage III,  seen today for evaluation of memory loss. MoCA today is17 /30.  Patient was being followed at [facility] clinic since 2021.  At the time, her SLUMS was 12/30. Patient was placed on donepezil 5 mg , discontinuing it for 1 year "I did not like taking the medication"and then resuming it in July 2024 after presenting to her PCP with the same complaints.  She has not been seen by neurology in 2 years.  Her husband is concerned that her memory may be worse than prior.  Patient is concerned because her mother had early onset of Alzheimer's disease.  Patient is able to participate on her IADLs and continues to drive without significant difficulties.  Because of her family history, her current performance, further workup is warranted, to further investigate her memory deficiencies.   Memory Impairment of unclear etiology  MRI brain without contrast to assess for underlying structural abnormality and assess vascular load   LP for formal diagnosis of Alzheimer's disease since the patient has been having memory issues for the last 3 years and is already on antidementia medication, as well as for having family history of early onset of Alzheimer's disease in her mother.  Increase donepezil to 10 mg daily for better coverage, side effects discussed Neurocognitive testing to further evaluate cognitive concerns and determine other underlying cause of memory changes, including potential contribution from sleep, anxiety, attention, or depression  Recommend psychotherapy for increased stress Check B12, TSH  Continue to control mood as per PCP Recommend good control of cardiovascular risk factors [place]ow up in 1-2  months   Subjective:   The patient is accompanied by her husband who supplements the history.   How long did patient have memory difficulties? For the last  3 years, worse over the last year.  Patient was initially seen by neurology at the [facility] clinic in 2021, and had been placed on donepezil 5 mg daily, discontinuing it for 1 year "I did not like taking the medication" and then resuming it in July 2024 after presenting to her PCP with the same complaints.  She has not been seen by neurology in 2 years.  She reports that her memory is now worse, especially with increased stress.  She is concerned because her mother has Alzheimer's disease.  Reports some difficulty remembering new information, conversations. "It is the storing part"-she says. She denies any issues with  recognizing family members . She forgets the stuff she was supposed to do, and has been reminded, but "not that often". Long-term memory is good.. Enjoys doing brain games.  repeats oneself?  Endorsed, not often Disoriented when walking into a room?  Patient denies    Leaving objects in unusual places? Denies.   Wandering behavior?  Denies. Any personality changes?  Denies. She is under some stress after her daughter moved in with the children after separating from her husband in [place]. Any history of depression?:  Denies   Hallucinations or paranoia?  Denies. Sometimes she feels that someone has been in the room, so if she is out she wants to come back to the house, making sure everything is closed.    Seizures?  Denies    Any sleep changes?   Sleeps well. "Not much" vivid dreams, REM behavior or sleepwalking   Sleep apnea?  Denies   Any hygiene concerns?  Denies  Independent of bathing and dressing?  Endorsed  Does the patient needs help with medications? Husband is in charge because she was missing doses. She now uses pillbox now .   Who is in charge of the finances? Husband is in charge     Any changes in appetite? Sometimes I am and sometimes I am not so hungry. She loves sweets.       Patient have trouble swallowing? Denies.   Does the patient cook? Not much. Any kitchen  accidents such as leaving the stove on? Denies.   Any history of headaches?   Denies.   Chronic pain ? Denies.   Ambulates with difficulty?  Denies.  She is active playing sports with her children, and she enjoys tennis and basketball.  Recent falls or head injuries? Denies.   Vision changes? Denies.   Unilateral weakness, numbness or tingling? Denies.   Any tremors?   Denies.   Any anosmia?  Denies.   Any incontinence of urine? Denies.   Any bowel dysfunction? Denies.      Patient lives with  her husband and her daughter with her children.   History of heavy alcohol intake? Denies.   History of heavy tobacco use? Denies.   Family history of dementia?  Mother with early onset AD Does patient drive? Yes, she denies getting lost.   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     Past Surgical History:  Procedure Laterality Date   ABDOMINAL HYSTERECTOMY     vaginal; partial   AUGMENTATION MAMMAPLASTY     COLONOSCOPY WITH PROPOFOL N/A 02/07/2022   Procedure: COLONOSCOPY WITH PROPOFOL;  Surgeon: Wyline Mood, MD;  Location: [facility] ENDOSCOPY;  Service: Gastroenterology;  Laterality: N/A;     Allergies  Allergen Reactions   Sulfamethoxazole-Trimethoprim Rash    Current Outpatient Medications  Medication Instructions   amLODipine-benazepril (LOTREL) 5-10 MG capsule 1 capsule, Oral, Daily   cholecalciferol (VITAMIN D) 1,000 Units, Oral, Daily   donepezil (ARICEPT) 5 mg, Oral, Daily at bedtime   Multiple Vitamins-Minerals (MULTIPLE VITAMINS/WOMENS PO) Oral    omeprazole (PRILOSEC) 20 mg, Oral, Daily PRN     VITALS:   Vitals:   07/22/23 0949  BP: (!) 156/85  Pulse: (!) 104  Resp: 20  SpO2: 97%  Weight: 129 lb (58.5 kg)  Height: 5\' 7"  (1.702 m)       PHYSICAL EXAM   HEENT:  Normocephalic, atraumatic. The superficial temporal arteries are without ropiness or tenderness. Cardiovascular: Regular rate and rhythm. Lungs: Clear to auscultation bilaterally. Neck: There are no carotid bruits noted bilaterally.  NEUROLOGICAL:     No data to display             12/10/2022    1:37 PM  MMSE - Mini Mental State Exam  Orientation to time 2  Orientation to Place 3  Registration 3  Attention/ Calculation 5  Recall 0  Language- name 2 objects 2  Language- repeat 1  Language- follow 3 step command 3  Language- read & follow direction 1  Write  a sentence 1  Copy design 1  Total score 22     Orientation:  Alert and oriented to person, not to place and time. No aphasia or dysarthria. Fund of knowledge is appropriate. Recent memory impaired and remote memory unremarkable.  Attention and concentration are reduced.  Able to name objects and repeat phrases. Delayed recall 0/5 Cranial nerves: There is good facial symmetry. Extraocular muscles are intact and visual fields are full to confrontational testing. Speech is fluent and clear. No tongue deviation. Hearing is intact to conversational tone. Tone: Tone is good throughout. Sensation: Sensation is intact to light touch. Vibration is intact at the bilateral big toe.  Coordination: The patient has no difficulty with RAM's or FNF bilaterally. Normal finger to nose  Motor: Strength is 5/5 in the bilateral upper and lower extremities. There is no pronator drift. There are no fasciculations noted. DTR's: Deep tendon reflexes are 2/4 bilaterally. Gait and Station: The patient is able to ambulate without difficulty.The patient is able to heel toe walk without any difficulty. Gait is cautious and  narrow. The patient is able to ambulate in a tandem fashion.       Thank you for allowing [place] the opportunity to participate in the care of this nice patient. Please do not hesitate to contact us for any questions or concerns.   Total time spent on today's visit was 60 minutes dedicated to this patient today, preparing to see patient, examining the patient, ordering tests and/or medications and counseling the patient, documenting clinical information in the EHR or other health record, independently interpreting results and communicating results to the patient/family, discussing treatment and goals, answering patient's questions and coordinating care.  Cc:  Jarome Matin 07/22/2023 10:32 AM

## 2023-07-24 NOTE — Progress Notes (Signed)
All the blood tests were normal except for vitamin B12 at the lower normal at 396 (we like it between 400-1,000).  Start taking B12 1000 mcg daily and follow-up with primary care physician.

## 2023-07-28 DIAGNOSIS — G309 Alzheimer's disease, unspecified: Secondary | ICD-10-CM | POA: Diagnosis not present

## 2023-08-12 ENCOUNTER — Encounter: Payer: Self-pay | Admitting: Physician Assistant

## 2023-08-12 ENCOUNTER — Encounter: Payer: Self-pay | Admitting: Family Medicine

## 2023-08-19 ENCOUNTER — Encounter: Payer: Self-pay | Admitting: Physician Assistant

## 2023-08-19 ENCOUNTER — Ambulatory Visit (INDEPENDENT_AMBULATORY_CARE_PROVIDER_SITE_OTHER): Payer: 59 | Admitting: Physician Assistant

## 2023-08-19 VITALS — BP 130/78 | HR 75 | Temp 98.0°F | Resp 16 | Ht 67.0 in | Wt 125.3 lb

## 2023-08-19 DIAGNOSIS — R109 Unspecified abdominal pain: Secondary | ICD-10-CM | POA: Diagnosis not present

## 2023-08-19 NOTE — Progress Notes (Signed)
Acute Office Visit   Patient: Teresa Dodson   DOB: November 01, 1961   62 y.o. Female  MRN: [id number] Visit Date: 08/19/2023  Today's healthcare provider: Oswaldo Conroy Requan Hardge, PA-C  Introduced myself to the patient as a Secondary school teacher and provided education on APPs in clinical practice.    Chief Complaint  Patient presents with   Abdominal Pain    Cramping lower abdomen, she ate shrimp lo mein a few days ago and has not picked up   Subjective    HPI HPI     Abdominal Pain    Additional comments: Cramping lower abdomen, she ate shrimp lo mein a few days ago and has not picked up      Last edited by Forde Radon, CMA on 08/19/2023 11:25 AM.      Abdominal pain  Onset: sudden  Duration: she reports it was ongoing for about 4 days (10/3-10/7) She thinks it has resolved at time of apt Associated symptoms: she reports cramping in the lower abdomen and around navel, appetite changes (she did not have much of an appetite for about 2 days) She reports a craving for salt while she had the stomach cramping   She denies fevers, chills, changes to bowel habits, nausea, vomiting   Interventions: nothing    Medications: Outpatient Medications Prior to Visit  Medication Sig   amLODipine-benazepril (LOTREL) 5-10 MG capsule Take 1 capsule by mouth daily.   cholecalciferol (VITAMIN D) 1000 units tablet Take 1,000 Units by mouth daily.   donepezil (ARICEPT) 10 MG tablet Take 1 tablet (10 mg total) by mouth at bedtime.   Multiple Vitamins-Minerals (MULTIPLE VITAMINS/WOMENS PO) Take by mouth.   No facility-administered medications prior to visit.    Review of Systems  Constitutional:  Negative for chills and fever.  Gastrointestinal:  Positive for abdominal pain and nausea. Negative for abdominal distention, blood in stool, constipation, diarrhea and vomiting.  Genitourinary:  Negative for dysuria.  Neurological:  Negative for dizziness, light-headedness and headaches.         Objective    BP 130/78   Pulse 75   Temp 98 F (36.7 C) (Oral)   Resp 16   Ht 5\' 7"  (1.702 m)   Wt 125 lb 4.8 oz (56.8 kg)   SpO2 99%   BMI 19.62 kg/m     Physical Exam Vitals reviewed.  Constitutional:      Appearance: Normal appearance. She is well-developed.  HENT:     Head: Normocephalic and atraumatic.  Cardiovascular:     Rate and Rhythm: Normal rate and regular rhythm.     Heart sounds: Normal heart sounds. No murmur heard.    No friction rub. No gallop.  Pulmonary:     Effort: Pulmonary effort is normal.     Breath sounds: Normal breath sounds.  Abdominal:     General: Abdomen is flat. Bowel sounds are normal.     Palpations: Abdomen is soft.     Tenderness: There is no abdominal tenderness.  Skin:    General: Skin is warm and dry.  Neurological:     General: No focal deficit present.     Mental Status: She is alert and oriented to person, place, and time.  Psychiatric:        Mood and Affect: Mood normal.        Behavior: Behavior normal.       No results found for any visits on 08/19/23.  Assessment & Plan  Return in about 3 months (around 11/19/2023) for HTN, HLD, CKD, memory.       Problem List Items Addressed This Visit   None Visit Diagnoses     Abdominal cramping    -  Primary Acute, resolved Patient reports about 4 days of lower abdominal cramping, abdominal pain and appetite changes that have resolved at time of appointment She denies fever, chills, changes to bowel movements, nausea, vomiting during symptom episode She reports that she is tolerating p.o. intake without issue Suspect potential indigestion, mild food poisoning, GI virus Given that symptoms have resolved recommend avoiding suspected trigger foods, focusing on increased hydration, gradual return to normal diet as tolerated Recommend if symptoms return that she uses Pepto-Bismol as well as bland diet for management.  Also can contact the office for further  recommendations Follow-up as needed for persistent or progressing symptoms        Return in about 3 months (around 11/19/2023) for HTN, HLD, CKD, memory.   I, Darrel Gloss E Nivaan Dicenzo, PA-C, have reviewed all documentation for this visit. The documentation on 08/19/23 for the exam, diagnosis, procedures, and orders are all accurate and complete.   Jacquelin Hawking, MHS, PA-C [facility]

## 2023-08-19 NOTE — Patient Instructions (Signed)
Please continue to eat small meals throughout the day and stay well hydrated with at least 70 oz of water per day If you start to have similar symptoms again, I recommend taking Pepto bismol to help with soothing your stomach.  If your symptoms return and you are unable to manage them at home please give our office a call.

## 2023-08-22 ENCOUNTER — Encounter: Payer: Self-pay | Admitting: Physician Assistant

## 2023-08-26 ENCOUNTER — Encounter: Payer: Self-pay | Admitting: Family Medicine

## 2023-08-26 ENCOUNTER — Ambulatory Visit (INDEPENDENT_AMBULATORY_CARE_PROVIDER_SITE_OTHER): Payer: 59 | Admitting: Family Medicine

## 2023-08-26 VITALS — BP 148/88 | HR 60 | Temp 97.8°F | Resp 14 | Ht 67.0 in | Wt 124.2 lb

## 2023-08-26 DIAGNOSIS — R319 Hematuria, unspecified: Secondary | ICD-10-CM

## 2023-08-26 DIAGNOSIS — Z87442 Personal history of urinary calculi: Secondary | ICD-10-CM

## 2023-08-26 LAB — POCT URINALYSIS DIPSTICK
Appearance: ABNORMAL
Glucose, UA: NEGATIVE
Nitrite, UA: NEGATIVE
Protein, UA: POSITIVE — AB
Spec Grav, UA: 1.015 (ref 1.010–1.025)
Urobilinogen, UA: 0.2 U/dL
pH, UA: 6 (ref 5.0–8.0)

## 2023-08-26 MED ORDER — CEPHALEXIN 500 MG PO CAPS: 500.0000 mg | ORAL_CAPSULE | Freq: Two times a day (BID) | ORAL | 0 refills | Status: AC

## 2023-08-26 MED ORDER — PHENAZOPYRIDINE HCL 100 MG PO TABS
100.0000 mg | ORAL_TABLET | Freq: Three times a day (TID) | ORAL | 0 refills | Status: DC | PRN
Start: 2023-08-26 — End: 2023-11-20

## 2023-08-26 NOTE — Progress Notes (Unsigned)
Patient ID: [name], female    DOB: Jan 22, 1961, 62 y.o.   MRN: [id number]  PCP: Danelle Berry, PA-C  Chief Complaint  Patient presents with   Hematuria    Subjective:   Teresa Dodson is a 62 y.o. female, presents to clinic with CC of the following:  HPI  Pt presents with gross hematuria for ~3 days and increased urinary frequency.  Only today did she start to have some lower abd achiness and mildly decreased appetite from her normal. She has hx of kidney stones but none for many years.  She did not have any flank pain or waves of pain when hematuria started.  She thought it was because she was eating red hot candies.  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      Current Outpatient Medications:    amLODipine-benazepril (LOTREL) 5-10 MG capsule, Take 1 capsule by mouth daily., Disp: 90 capsule, Rfl: 3   cholecalciferol (VITAMIN D) 1000 units tablet, Take 1,000 Units by mouth daily., Disp: , Rfl:    donepezil (ARICEPT) 10 MG tablet, Take 1 tablet (10 mg total) by mouth at bedtime., Disp: 30 tablet, Rfl: 11   Multiple Vitamins-Minerals (MULTIPLE VITAMINS/WOMENS PO), Take by mouth., Disp: , Rfl:    Allergies  Allergen Reactions   Sulfamethoxazole-Trimethoprim Rash     Social History   Tobacco Use   Smoking status: Never   Smokeless tobacco: Never  Vaping Use   Vaping status: Never Used  Substance Use Topics   Alcohol use: Yes    Alcohol/week: 2.0 standard  drinks of alcohol    Types: 2 Glasses of wine per week    Comment: ocassionally   Drug use: No      Chart Review Today: I personally reviewed active problem list, medication list, allergies, family history, social history, health maintenance, notes from last encounter, lab results, imaging with the patient/caregiver today.   Review of Systems  Constitutional: Negative.   HENT: Negative.    Eyes: Negative.   Respiratory: Negative.    Cardiovascular: Negative.   Gastrointestinal: Negative.   Endocrine: Negative.   Genitourinary: Negative.   Musculoskeletal: Negative.   Skin: Negative.   Allergic/Immunologic: Negative.   Neurological: Negative.   Hematological: Negative.   Psychiatric/Behavioral: Negative.    All other systems reviewed and are negative.      Objective:   Vitals:   08/26/23 1125 08/26/23 1137  BP:  (!) 148/88  Pulse:  60  Resp: 18 14  Temp:  97.8 F (36.6 C)  TempSrc:  Oral  SpO2:  99%  Weight:  124 lb 3.2 oz (56.3 kg)  Height: 5\' 7"  (1.702 m) 5\' 7"  (1.702 m)    Body mass index is 19.45 kg/m.  Physical Exam Vitals and nursing note reviewed.  Constitutional:      General: She is not in acute distress.    Appearance: Normal appearance. She is well-developed. She is not ill-appearing, toxic-appearing or diaphoretic.  HENT:  Head: Normocephalic and atraumatic.     Nose: Nose normal.  Eyes:     General:        Right eye: No discharge.        Left eye: No discharge.     Conjunctiva/sclera: Conjunctivae normal.  Neck:     Trachea: No tracheal deviation.  Cardiovascular:     Rate and Rhythm: Normal rate and regular rhythm.     Pulses: Normal pulses.     Heart sounds: Normal heart sounds.  Pulmonary:     Effort: Pulmonary effort is normal. No respiratory distress.     Breath sounds: Normal breath sounds. No stridor.  Abdominal:     General: Bowel sounds are normal. There is no distension.     Palpations: Abdomen is soft.     Tenderness:  There is no abdominal tenderness. There is no right CVA tenderness, left CVA tenderness, guarding or rebound.  Skin:    General: Skin is warm and dry.     Findings: No rash.  Neurological:     Mental Status: She is alert.     Motor: No abnormal muscle tone.     Coordination: Coordination normal.  Psychiatric:        Mood and Affect: Mood normal.        Behavior: Behavior normal.           Assessment & Plan:   1. Hematuria, unspecified type Large blood on dip- pictures of urine consistent with gross hematuria She is otherwise well appearing, abd exam benign No flank pain or sx consistent with her past kidney stones, she does wish to cover for possible UTI, will cover with keflex  - POCT urinalysis dipstick - Urine Culture - Urine Microscopic - phenazopyridine (PYRIDIUM) 100 MG tablet; Take 1 tablet (100 mg total) by mouth 3 (three) times daily as needed for pain.  Dispense: 10 tablet; Refill: 0   2. History of nephrolithiasis No flank pain or colicky pain - POCT urinalysis dipstick   encouraged pt to continue to push fluids, she can try pyridium for new lower abd/pelvic discomfort (may be UTI) Urine sent for microscopy and culture.   If no UTI we will need to recheck urine and if she has continued hematuria (gross or microscopic) we will likely need to do f/up imaging or refer her to urology   Danelle Berry, PA-C 08/26/23 11:38 AM

## 2023-08-27 LAB — URINE CULTURE
MICRO NUMBER:: 15627700
Result:: NO GROWTH
SPECIMEN QUALITY:: ADEQUATE

## 2023-08-27 LAB — URINALYSIS, MICROSCOPIC ONLY: Hyaline Cast: NONE SEEN /[LPF]

## 2023-09-02 ENCOUNTER — Ambulatory Visit
Admission: RE | Admit: 2023-09-02 | Discharge: 2023-09-02 | Disposition: A | Discharge status: Home / Self Care | Payer: 59 | Source: Ambulatory Visit | Attending: Physician Assistant | Admitting: Physician Assistant

## 2023-09-02 DIAGNOSIS — R413 Other amnesia: Secondary | ICD-10-CM | POA: Diagnosis not present

## 2023-09-02 DIAGNOSIS — R9082 White matter disease, unspecified: Secondary | ICD-10-CM | POA: Diagnosis not present

## 2023-09-11 ENCOUNTER — Ambulatory Visit: Payer: 59 | Admitting: Physician Assistant

## 2023-09-11 ENCOUNTER — Encounter: Payer: Self-pay | Admitting: Physician Assistant

## 2023-09-11 VITALS — BP 135/80 | HR 74 | Resp 20 | Ht 67.0 in

## 2023-09-11 DIAGNOSIS — R413 Other amnesia: Secondary | ICD-10-CM

## 2023-09-11 NOTE — Progress Notes (Signed)
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 RH female  with a history of hypertension, hyperlipidemia, CKD stage III seen today in follow up to discuss the MRI from 09/02/2023 of the brain results. These were personally reviewed, remarkable for moderate chronic microvascular changes, progressed from prior MRI, without acute findings as well as cerebral atrophy. Lumbar puncture for diagnosis was ordered, but patient and husband decline any other testing unless memory changes would occur. at this time patient is currently on donepezil 10 mg daily, tolerating well.  She was last seen on 07/22/2023, at which time her MoCA was 17/30. This patient is accompanied in the office by her husband who supplements the history.  Previous records as well as any outside records available were reviewed prior to todays visit.     Follow up in  6 months. Continue donepezil 10 mg daily, side effects discussed Continue B12 supplement Continue to control mood as per PCP Recommend good control of cardiovascular risk factors   Initial visit 07/22/23 How long did patient have memory difficulties? For the last 3 years, worse over the last year.  Patient was initially seen by neurology at the [facility] clinic in 2021, and had been placed on donepezil 5 mg daily, discontinuing it for 1 year "I did not like taking the medication" and then resuming it in July 2024 after presenting to her PCP with the same complaints.  She has not been seen by neurology in 2 years.  She reports that her memory is now worse, especially with increased stress.  She is concerned because her mother has Alzheimer's disease.  Reports some difficulty remembering new information, conversations. "It is the storing part"-she says. She denies any issues with  recognizing family members . She forgets the stuff she was supposed to do, and has been reminded, but "not that  often". Long-term memory is good.. Enjoys doing brain games.  repeats oneself?  Endorsed, not often Disoriented when walking into a room?  Patient denies    Leaving objects in unusual places? Denies.   Wandering behavior?  Denies. Any personality changes?  Denies. She is under some stress after her daughter moved in with the children after separating from her husband in [place]. Any history of depression?:  Denies   Hallucinations or paranoia?  Denies. Sometimes she feels that someone has been in the room, so if she is out she wants to come back to the house, making sure everything is closed.    Seizures?  Denies    Any sleep changes?   Sleeps well. "Not much" vivid dreams, REM behavior or sleepwalking   Sleep apnea?  Denies   Any hygiene concerns?  Denies   Independent of bathing and dressing?  Endorsed  Does the patient needs help with medications? Husband is in charge because she was missing doses. She now uses pillbox now .   Who is in charge of the finances? Husband is in charge     Any changes in appetite? Sometimes I am and sometimes I am not so hungry. She loves sweets.       Patient have trouble swallowing? Denies.   Does the patient cook? Not much. Any kitchen accidents such as leaving the stove on? Denies.   Any history of headaches?   Denies.   Chronic pain ? Denies.   Ambulates with difficulty?  Denies.  She is active playing sports with her children, and she  enjoys tennis and basketball.  Recent falls or head injuries? Denies.   Vision changes? Denies.   Unilateral weakness, numbness or tingling? Denies.   Any tremors?   Denies.   Any anosmia?  Denies.   Any incontinence of urine? Denies.   Any bowel dysfunction? Denies.      Patient lives with  her husband and her daughter with her children.   History of heavy alcohol intake? Denies.   History of heavy tobacco use? Denies.   Family history of dementia?  Mother with early onset AD Does patient drive? Yes, she denies  getting lost.    MRI of the brain without contrast 2021, personally reviewed showed mild to moderate white matter changes consistent with chronic microvascular ischemia  CURRENT MEDICATIONS:  Outpatient Encounter Medications as of 09/11/2023  Medication Sig   amLODipine-benazepril (LOTREL) 5-10 MG capsule Take 1 capsule by mouth daily.   cholecalciferol (VITAMIN D) 1000 units tablet Take 1,000 Units by mouth daily.   donepezil (ARICEPT) 10 MG tablet Take 1 tablet (10 mg total) by mouth at bedtime.   Multiple Vitamins-Minerals (MULTIPLE VITAMINS/WOMENS PO) Take by mouth.   phenazopyridine (PYRIDIUM) 100 MG tablet Take 1 tablet (100 mg total) by mouth 3 (three) times daily as needed for pain.   No facility-administered encounter medications on file as of 09/11/2023.       12/10/2022    1:37 PM  MMSE - Mini Mental State Exam  Orientation to time 2  Orientation to Place 3  Registration 3  Attention/ Calculation 5  Recall 0  Language- name 2 objects 2  Language- repeat 1  Language- follow 3 step command 3  Language- read & follow direction 1  Write a sentence 1  Copy design 1  Total score 22      07/25/2023   11:00 AM  Montreal Cognitive Assessment   Visuospatial/ Executive (0/5) 3  Naming (0/3) 3  Attention: Read list of digits (0/2) 2  Attention: Read list of letters (0/1) 1  Attention: Serial 7 subtraction starting at 100 (0/3) 0  Language: Repeat phrase (0/2) 2  Language : Fluency (0/1) 1  Abstraction (0/2) 1  Delayed Recall (0/5) 0  Orientation (0/6) 4  Total 17   Thank you for allowing [place] the opportunity to participate in the care of this nice patient. Please do not hesitate to contact us for any questions or concerns.   Total time spent on today's visit was 37 minutes dedicated to this patient today, preparing to see patient, examining the patient, ordering tests and/or medications and counseling the patient, documenting clinical information in the EHR or other health  record, independently interpreting results and communicating results to the patient/family, discussing treatment and goals, answering patient's questions and coordinating care.  Cc:  Jarome Matin 09/11/2023 6:07 AM

## 2023-09-11 NOTE — Patient Instructions (Addendum)
It was a pleasure to see you today at our office.   Recommendations:   Continue donepezil 10 mg daily  Follow up in 6 month s   For psychiatric meds, mood meds: Please have your primary care physician manage these medications.  If you have any severe symptoms of a stroke, or other severe issues such as confusion,severe chills or fever, etc call 911 or go to the ER as you may need to be evaluated further    For assessment of decision of mental capacity and competency:  Call Dr. Erick Blinks, geriatric psychiatrist at [phone]  Counseling regarding caregiver distress, including caregiver depression, anxiety and issues regarding community resources, adult day care programs, adult living facilities, or memory care questions:  please contact your  Primary Doctor's Social Worker   Whom to call: Memory  decline, memory medications: Call our office [phone]    [email or web address]   RECOMMENDATIONS FOR ALL PATIENTS WITH MEMORY PROBLEMS: 1. Continue to exercise (Recommend 30 minutes of walking everyday, or 3 hours every week) 2. Increase social interactions - continue going to [place] and enjoy social gatherings with friends and family 3. Eat healthy, avoid fried foods and eat more fruits and vegetables 4. Maintain adequate blood pressure, blood sugar, and blood cholesterol level. Reducing the risk of stroke and cardiovascular disease also helps promoting better memory. 5. Avoid stressful situations. Live a simple life and avoid aggravations. Organize your time and prepare for the next day in anticipation. 6. Sleep well, avoid any interruptions of sleep and avoid any distractions in the bedroom that may interfere with adequate sleep quality 7. Avoid sugar, avoid sweets as there is a strong link between excessive sugar intake, diabetes, and cognitive impairment We discussed the Mediterranean diet, which has been shown to help  patients reduce the risk of progressive memory disorders and reduces cardiovascular risk. This includes eating fish, eat fruits and green leafy vegetables, nuts like almonds and hazelnuts, walnuts, and also use olive oil. Avoid fast foods and fried foods as much as possible. Avoid sweets and sugar as sugar use has been linked to worsening of memory function.  There is always a concern of gradual progression of memory problems. If this is the case, then we may need to adjust level of care according to patient needs. Support, both to the patient and caregiver, should then be put into place.      You have been referred for a neuropsychological evaluation (i.e., evaluation of memory and thinking abilities). Please bring someone with you to this appointment if possible, as it is helpful for the doctor to hear from both you and another adult who knows you well. Please bring eyeglasses and hearing aids if you wear them.    The evaluation will take approximately 3 hours and has two parts:   The first part is a clinical interview with the neuropsychologist (Dr. Milbert Coulter or Dr. Roseanne Reno). During the interview, the neuropsychologist will speak with you and the individual you brought to the appointment.    The second part of the evaluation is testing with the doctor's technician Annabelle Harman or Selena Batten). During the testing, the technician will ask you to remember different types of material, solve problems, and answer some questionnaires. Your family member will not be present for this portion of the evaluation.   Please note: We must reserve several hours of the neuropsychologist's time and the psychometrician's time for your evaluation appointment. As such, there is a No-Show fee of $100. If you are unable to  attend any of your appointments, please contact our office as soon as possible to reschedule.      DRIVING: Regarding driving, in patients with progressive memory problems, driving will be impaired. We advise to have  someone else do the driving if trouble finding directions or if minor accidents are reported. Independent driving assessment is available to determine safety of driving.   If you are interested in the driving assessment, you can contact the following:  [facility] [employer] [facility] in [place] [phone]  [facility] [phone]  [facility] [phone]  [facility] [phone] or [phone]   FALL PRECAUTIONS: Be cautious when walking. Scan the area for obstacles that may increase the risk of trips and falls. When getting up in the mornings, sit up at the edge of the bed for a few minutes before getting out of bed. Consider elevating the bed at the head end to avoid drop of blood pressure when getting up. Walk always in a well-lit room (use night lights in the walls). Avoid area rugs or power cords from appliances in the middle of the walkways. Use a walker or a cane if necessary and consider physical therapy for balance exercise. Get your eyesight checked regularly.  FINANCIAL OVERSIGHT: Supervision, especially oversight when making financial decisions or transactions is also recommended.  HOME SAFETY: Consider the safety of the kitchen when operating appliances like stoves, microwave oven, and blender. Consider having supervision and share cooking responsibilities until no longer able to participate in those. Accidents with firearms and other hazards in the house should be identified and addressed as well.   ABILITY TO BE LEFT ALONE: If patient is unable to contact 911 operator, consider using LifeLine, or when the need is there, arrange for someone to stay with patients. Smoking is a fire hazard, consider supervision or cessation. Risk of wandering should be assessed by caregiver and if detected at any point, supervision and safe proof recommendations should be instituted.  MEDICATION SUPERVISION: Inability to self-administer medication needs to be  constantly addressed. Implement a mechanism to ensure safe administration of the medications.      Mediterranean Diet A Mediterranean diet refers to food and lifestyle choices that are based on the traditions of countries located on the [employer]. This way of eating has been shown to help prevent certain conditions and improve outcomes for people who have chronic diseases, like kidney disease and heart disease. What are tips for following this plan? Lifestyle  Cook and eat meals together with your family, when possible. Drink enough fluid to keep your urine clear or pale yellow. Be physically active every day. This includes: Aerobic exercise like running or swimming. Leisure activities like gardening, walking, or housework. Get 7-8 hours of sleep each night. If recommended by your health care provider, drink red wine in moderation. This means 1 glass a day for nonpregnant women and 2 glasses a day for men. A glass of wine equals 5 oz (150 mL). Reading food labels  Check the serving size of packaged foods. For foods such as rice and pasta, the serving size refers to the amount of cooked product, not dry. Check the total fat in packaged foods. Avoid foods that have saturated fat or trans fats. Check the ingredients list for added sugars, such as corn syrup. Shopping  At the grocery store, buy most of your food from the areas near the walls of the store. This includes: Fresh fruits and vegetables (produce). Grains, beans, nuts, and seeds. Some of  these may be available in unpackaged forms or large amounts (in bulk). Fresh seafood. Poultry and eggs. Low-fat dairy products. Buy whole ingredients instead of prepackaged foods. Buy fresh fruits and vegetables in-season from local farmers markets. Buy frozen fruits and vegetables in resealable bags. If you do not have access to quality fresh seafood, buy precooked frozen shrimp or canned fish, such as tuna, salmon, or sardines. Buy  small amounts of raw or cooked vegetables, salads, or olives from the deli or salad bar at your store. Stock your pantry so you always have certain foods on hand, such as olive oil, canned tuna, canned tomatoes, rice, pasta, and beans. Cooking  Cook foods with extra-virgin olive oil instead of using butter or other vegetable oils. Have meat as a side dish, and have vegetables or grains as your main dish. This means having meat in small portions or adding small amounts of meat to foods like pasta or stew. Use beans or vegetables instead of meat in common dishes like chili or lasagna. Experiment with different cooking methods. Try roasting or broiling vegetables instead of steaming or sauteing them. Add frozen vegetables to soups, stews, pasta, or rice. Add nuts or seeds for added healthy fat at each meal. You can add these to yogurt, salads, or vegetable dishes. Marinate fish or vegetables using olive oil, lemon juice, garlic, and fresh herbs. Meal planning  Plan to eat 1 vegetarian meal one day each week. Try to work up to 2 vegetarian meals, if possible. Eat seafood 2 or more times a week. Have healthy snacks readily available, such as: Vegetable sticks with hummus. Greek yogurt. Fruit and nut trail mix. Eat balanced meals throughout the week. This includes: Fruit: 2-3 servings a day Vegetables: 4-5 servings a day Low-fat dairy: 2 servings a day Fish, poultry, or lean meat: 1 serving a day Beans and legumes: 2 or more servings a week Nuts and seeds: 1-2 servings a day Whole grains: 6-8 servings a day Extra-virgin olive oil: 3-4 servings a day Limit red meat and sweets to only a few servings a month What are my food choices? Mediterranean diet Recommended Grains: Whole-grain pasta. Brown rice. Bulgar wheat. Polenta. Couscous. Whole-wheat bread. Orpah Cobb. Vegetables: Artichokes. Beets. Broccoli. Cabbage. Carrots. Eggplant. Green beans. Chard. Kale. Spinach. Onions. Leeks. Peas.  Squash. Tomatoes. Peppers. Radishes. Fruits: Apples. Apricots. Avocado. Berries. Bananas. Cherries. Dates. Figs. Grapes. Lemons. Melon. Oranges. Peaches. Plums. Pomegranate. Meats and other protein foods: Beans. Almonds. Sunflower seeds. Pine nuts. Peanuts. Cod. Salmon. Scallops. Shrimp. Tuna. Tilapia. Clams. Oysters. Eggs. Dairy: Low-fat milk. Cheese. Greek yogurt. Beverages: Water. Red wine. Herbal tea. Fats and oils: Extra virgin olive oil. Avocado oil. Grape seed oil. Sweets and desserts: [place] yogurt with honey. Baked apples. Poached pears. Trail mix. Seasoning and other foods: Basil. Cilantro. Coriander. Cumin. Mint. Parsley. Sage. Rosemary. Tarragon. Garlic. Oregano. Thyme. Pepper. Balsalmic vinegar. Tahini. Hummus. Tomato sauce. Olives. Mushrooms. Limit these Grains: Prepackaged pasta or rice dishes. Prepackaged cereal with added sugar. Vegetables: Deep fried potatoes (french fries). Fruits: Fruit canned in syrup. Meats and other protein foods: Beef. Pork. Lamb. Poultry with skin. Hot dogs. Tomasa Blase. Dairy: Ice cream. Sour cream. Whole milk. Beverages: Juice. Sugar-sweetened soft drinks. Beer. Liquor and spirits. Fats and oils: Butter. Canola oil. Vegetable oil. Beef fat (tallow). Lard. Sweets and desserts: Cookies. Cakes. Pies. Candy. Seasoning and other foods: Mayonnaise. Premade sauces and marinades. The items listed may not be a complete list. Talk with your dietitian about what dietary choices are right for  you. Summary The Mediterranean diet includes both food and lifestyle choices. Eat a variety of fresh fruits and vegetables, beans, nuts, seeds, and whole grains. Limit the amount of red meat and sweets that you eat. Talk with your health care provider about whether it is safe for you to drink red wine in moderation. This means 1 glass a day for nonpregnant women and 2 glasses a day for men. A glass of wine equals 5 oz (150 mL). This information is not intended to replace advice  given to you by your health care provider. Make sure you discuss any questions you have with your health care provider. Document Released: 06/13/2016 Document Revised: 07/16/2016 Document Reviewed: 06/13/2016 Elsevier Interactive Patient Education  2017 [employer].   Labs today [address] [facility] [phone] for MRI and LP

## 2023-09-16 ENCOUNTER — Ambulatory Visit (INDEPENDENT_AMBULATORY_CARE_PROVIDER_SITE_OTHER): Payer: 59 | Admitting: Physician Assistant

## 2023-09-16 ENCOUNTER — Other Ambulatory Visit (HOSPITAL_COMMUNITY)
Admission: RE | Admit: 2023-09-16 | Discharge: 2023-09-16 | Disposition: A | Discharge status: Home / Self Care | Payer: 59 | Source: Ambulatory Visit | Attending: Physician Assistant | Admitting: Physician Assistant

## 2023-09-16 ENCOUNTER — Encounter: Payer: Self-pay | Admitting: Physician Assistant

## 2023-09-16 VITALS — BP 142/86 | HR 72 | Temp 97.9°F | Resp 16 | Ht 67.0 in | Wt 125.9 lb

## 2023-09-16 DIAGNOSIS — R300 Dysuria: Secondary | ICD-10-CM | POA: Diagnosis not present

## 2023-09-16 DIAGNOSIS — B9689 Other specified bacterial agents as the cause of diseases classified elsewhere: Secondary | ICD-10-CM | POA: Diagnosis not present

## 2023-09-16 DIAGNOSIS — N760 Acute vaginitis: Secondary | ICD-10-CM | POA: Diagnosis not present

## 2023-09-16 NOTE — Progress Notes (Signed)
Acute Office Visit   Patient: Teresa Dodson   DOB: April 15, 1961   62 y.o. Female  MRN: [id number] Visit Date: 09/16/2023  Today's healthcare provider: Oswaldo Conroy Gweneth Fredlund, PA-C  Introduced myself to the patient as a Secondary school teacher and provided education on APPs in clinical practice.    Chief Complaint  Patient presents with   Dysuria    Onset for a day, pt may have taken AZO unable to due dipstick due to color of urine and flags all the controls on dipstick.   Back Pain   Subjective    HPI HPI     Dysuria    Additional comments: Onset for a day, pt may have taken AZO unable to due dipstick due to color of urine and flags all the controls on dipstick.      Last edited by Forde Radon, CMA on 09/16/2023 10:41 AM.      Concern for dysuria   She reports this has been ongoing for about 2 days  She states she is having burning sensation along the urethral opening- she also admits to back pain (low back/right hip area)  She denies urinary hesitancy, having to push or strain, difficulty urinating or enuresis  She reports her urine was red - denies hematuria? She denies taking Pyridium lately  Urine dip was inconclusive due to orange coloration of urine-suspect she has taken Pyridium for current symptoms Patient is an unreliable historian at times so unsure if she was having gross hematuria prior to using Pyridium     Medications: Outpatient Medications Prior to Visit  Medication Sig   amLODipine-benazepril (LOTREL) 5-10 MG capsule Take 1 capsule by mouth daily.   cholecalciferol (VITAMIN D) 1000 units tablet Take 1,000 Units by mouth daily.   donepezil (ARICEPT) 10 MG tablet Take 1 tablet (10 mg total) by mouth at bedtime.   Multiple Vitamins-Minerals (MULTIPLE VITAMINS/WOMENS PO) Take by mouth.   phenazopyridine (PYRIDIUM) 100 MG tablet Take 1 tablet (100 mg total) by mouth 3 (three) times daily as needed for pain.   No facility-administered medications prior to  visit.    Review of Systems  Constitutional:  Negative for chills and fever.  Genitourinary:  Positive for dysuria and flank pain. Negative for difficulty urinating, enuresis, frequency, hematuria, urgency, vaginal bleeding, vaginal discharge and vaginal pain.  Skin:  Negative for rash.  Neurological:  Negative for dizziness, light-headedness and headaches.        Objective    BP (!) 142/86   Pulse 72   Temp 97.9 F (36.6 C) (Oral)   Resp 16   Ht 5\' 7"  (1.702 m)   Wt 125 lb 14.4 oz (57.1 kg)   SpO2 99%   BMI 19.72 kg/m     Physical Exam Vitals reviewed.  Constitutional:      General: She is awake.     Appearance: Normal appearance. She is well-developed and well-groomed.  HENT:     Head: Normocephalic and atraumatic.  Pulmonary:     Effort: Pulmonary effort is normal.  Abdominal:     General: Abdomen is flat. Bowel sounds are normal.     Palpations: Abdomen is soft.     Tenderness: There is no abdominal tenderness. There is no right CVA tenderness or left CVA tenderness.  Musculoskeletal:     Cervical back: Normal range of motion.  Neurological:     Mental Status: She is alert.     GCS: GCS eye subscore is 4.  GCS verbal subscore is 5. GCS motor subscore is 6.  Psychiatric:        Mood and Affect: Mood normal.        Behavior: Behavior normal. Behavior is cooperative.       No results found for any visits on 09/16/23.  Assessment & Plan      No follow-ups on file.      Problem List Items Addressed This Visit   None Visit Diagnoses     Dysuria    -  Primary   Relevant Orders   Urine Culture   Cervicovaginal ancillary only      Acute, new concern Patient is overall unreliable historian so unsure how long this has been ongoing and if she has taken Pyridium but urine sample in office is orange and we cannot do dip stick analysis Will send sample for culture for now and get cervicovaginal swab - unsure if she has completed cervicovaginal swab  satisfactorily as CMAs did find her trying to dip first swab in her urine sample. She was provided with second swab and repeated directions for completion  Results to dictate further management  Follow up as needed for persistent or progressing symptoms     No follow-ups on file.   I, Myonna Chisom E Wilbern Pennypacker, PA-C, have reviewed all documentation for this visit. The documentation on 09/16/23 for the exam, diagnosis, procedures, and orders are all accurate and complete.   Jacquelin Hawking, MHS, PA-C [facility]

## 2023-09-17 LAB — CERVICOVAGINAL ANCILLARY ONLY
Bacterial Vaginitis (gardnerella): POSITIVE — AB
Candida Glabrata: NEGATIVE
Candida Vaginitis: NEGATIVE
Comment: NEGATIVE
Comment: NEGATIVE
Comment: NEGATIVE
Comment: NEGATIVE
Trichomonas: NEGATIVE

## 2023-09-17 LAB — URINE CULTURE
MICRO NUMBER:: 15719796
Result:: NO GROWTH
SPECIMEN QUALITY:: ADEQUATE

## 2023-09-18 MED ORDER — METRONIDAZOLE 500 MG PO TABS: 500.0000 mg | ORAL_TABLET | Freq: Two times a day (BID) | ORAL | 0 refills | Status: DC

## 2023-09-18 NOTE — Progress Notes (Signed)
Your cervicovaginal swab was positive for bacterial vaginosis.  I have sent in a script for Flagyl to be taken by mouth twice per day for 7 days. Please finish the entire course unless you are instructed to stop or develop an allergic reaction. Please note that this medication can cause severe nausea and vomiting if alcohol is consumed while you are taking it so please refrain from this during the week you are on it. Please let us know if you have further questions or concerns.  Your urine culture was negative for signs of bacterial infection so you do not need and antibiotic for this at this time.

## 2023-09-18 NOTE — Addendum Note (Signed)
Addended by: Jacquelin Hawking on: 09/18/2023 04:21 PM   Modules accepted: Orders

## 2023-09-22 ENCOUNTER — Telehealth: Payer: Self-pay

## 2023-09-22 NOTE — Telephone Encounter (Signed)
Patient declined the Lumbar spinal tap. FYI. Per [employer].

## 2023-11-04 ENCOUNTER — Encounter: Payer: Self-pay | Admitting: Psychology

## 2023-11-06 ENCOUNTER — Ambulatory Visit (INDEPENDENT_AMBULATORY_CARE_PROVIDER_SITE_OTHER): Payer: 59 | Admitting: Psychology

## 2023-11-06 ENCOUNTER — Encounter: Payer: Self-pay | Admitting: Psychology

## 2023-11-06 ENCOUNTER — Ambulatory Visit: Payer: Medicaid Other

## 2023-11-06 DIAGNOSIS — F0670 Mild neurocognitive disorder due to known physiological condition without behavioral disturbance: Secondary | ICD-10-CM

## 2023-11-06 DIAGNOSIS — G3184 Mild cognitive impairment, so stated: Secondary | ICD-10-CM

## 2023-11-06 DIAGNOSIS — R4189 Other symptoms and signs involving cognitive functions and awareness: Secondary | ICD-10-CM

## 2023-11-06 HISTORY — DX: Mild cognitive impairment of uncertain or unknown etiology: G31.84

## 2023-11-06 HISTORY — DX: Mild neurocognitive disorder due to known physiological condition without behavioral disturbance: F06.70

## 2023-11-06 NOTE — Progress Notes (Signed)
 NEUROPSYCHOLOGICAL EVALUATION [facility] [place] Department of Neurology  Date of Evaluation: November 06, 2023  Reason for Referral:   Teresa Dodson is a 63 y.o. right-handed African-American female referred by Teresa Sevin, PA-C, to characterize her current cognitive functioning and assist with diagnostic clarity and treatment planning in the context of subjective cognitive decline and a positive family history of Alzheimer's disease.   Assessment and Plan:   Clinical Impression(s): Teresa Dodson's pattern of performance is suggestive of severe impairment surrounding all aspects of learning and memory. A normative impairment was also exhibited across receptive language, likely due to memory impairment rather than frank comprehension difficulties. Relative weaknesses were exhibited across complex attention and verbal fluency while performance variability was exhibited across executive functioning, confrontation naming, and visuospatial abilities. Performances were appropriate relative to age-matched peers across processing speed and basic attention. Teresa Dodson denied difficulties completing instrumental activities of daily living (ADLs) independently. Her husband was in agreement. 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  While the cause of her memory impairment is somewhat uncertain, I do have concerns for an underlying neurodegenerative illness, namely Alzheimer's disease. Teresa Dodson was fully amnestic (i.e. 0% retention) across all memory tasks after a brief delay and performed quite poorly across yes/no recognition trials. This suggests rapid forgetting and a pronounced storage impairment, both of which are the hallmark testing patterns of this illness. Further variability/weakness surrounding confrontation naming would follow typical disease  trajectory. Symptomatic Alzheimer's disease in a 63 year old individual would be fairly rare. However, there does appear to be a family history (i.e., her mother) of an early-onset presentation, likely elevating Teresa Dodson's overall risk. 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 Continued medical monitoring will be important moving forward.   Recommendations: I agree with Teresa Dodson and, given Teresa Dodson's age, also recommend that she undergo a lumbar puncture. This test would allow for greater diagnostic clarity surrounding pathological signs of Alzheimer's disease to map onto current testing patterns.   A repeat neuropsychological evaluation in 12-18 months (or sooner if functional decline is noted) is recommended to assess the trajectory of future cognitive decline should it occur. This will also aid in future efforts towards improved diagnostic clarity.  Teresa Dodson has already been prescribed a medication aimed to address memory loss and concerns surrounding Alzheimer's disease (i.e., donepezil /Aricept ). Her husband expressed concern surrounding side effects of this medication (i.e., diminished appetite). This can occur with this medication and I would encourage Teresa Dodson to discuss this with Teresa Dodson during their next appointment. It is important to highlight that this medication has been shown to slow functional decline in some individuals. There is no current treatment which can stop or reverse cognitive decline when caused by a neurodegenerative illness.   Performance across neurocognitive testing is not a strong predictor of an individual's safety operating a motor vehicle. Should her family wish to pursue a formalized driving evaluation, they could reach out to the following agencies: [employer] in [place]:  [phone] [facility]: [phone] [facility] [place]: [phone] [facility]: [phone] or [phone]  Should there be progression of current deficits over time, Teresa Dodson is unlikely to regain any independent living skills lost. Therefore, it is recommended that she remain as involved  as possible in all aspects of household chores, finances, and medication management, with supervision to ensure adequate performance. She will likely benefit from the establishment and maintenance of a routine in order to maximize her functional abilities over time.  It will be important for Teresa Dodson to have another person with her when in situations where she may need to process information, weigh the pros and cons of different options, and make decisions, in order to ensure that she fully understands and recalls all information to be considered.  If not already done, Teresa Dodson and her family may want to discuss her wishes regarding durable power of attorney and medical decision making, so that she can have input into these choices. If they require legal assistance with this, long-term care resource access, or other aspects of estate planning, they could reach out to [employer] at [phone] for a free consultation. Additionally, they may wish to discuss future plans for caretaking and seek out community options for in home/residential care should they become necessary.  Teresa Dodson is encouraged to attend to lifestyle factors for brain health (e.Dodson., regular physical exercise, good nutrition habits and consideration of the MIND-DASH diet, regular participation in cognitively-stimulating activities, and general stress management techniques), which are likely to have benefits for both emotional adjustment and cognition. Optimal control of vascular risk factors (including safe cardiovascular exercise and adherence to dietary recommendations) is encouraged. Continued  participation in activities which provide mental stimulation and social interaction is also recommended.   Important information should be provided to Teresa Dodson in written format in all instances. This information should be placed in a highly frequented and easily visible location within her home to promote recall. External strategies such as written notes in a consistently used memory journal, visual and nonverbal auditory cues such as a calendar on the refrigerator or appointments with alarm, such as on a cell phone, can also help maximize recall.  To address problems with fluctuating attention and/or executive dysfunction, she may wish to consider:   -Avoiding external distractions when needing to concentrate   -Limiting exposure to fast paced environments with multiple sensory demands   -Writing down complicated information and using checklists   -Attempting and completing one task at a time (i.e., no multi-tasking)   -Verbalizing aloud each step of a task to maintain focus   -Taking frequent breaks during the completion of steps/tasks to avoid fatigue   -Reducing the amount of information considered at one time   -Scheduling more difficult activities for a time of day where she is usually most alert  Review of Records:   Teresa Dodson was seen by [facility] Jayson Sevin, PA-C) on 07/22/2023 for an evaluation of memory loss. At that time, memory loss was said to be present for the prior three years with exacerbated symptoms during the past year. She had been seen at the [facility] clinic in 2021 and was prescribed a low dose of donepezil . Reportedly, she took this medication initially but discontinued it due to her not liking how it made her feel. She was restarted on this medication in July 2024 by her PCP. Memory loss examples surrounded trouble recalling newer information such as a recent conversations. She did report increased stress stemming from her daughter and grandchildren moving in  with her and her husband recently. ADL dysfunction was denied. Performance on a brief cognitive screening instrument (MOCA) was 17/30. Ultimately, Ms. Hasten was referred for a comprehensive neuropsychological evaluation to characterize her cognitive abilities and  to assist with diagnostic clarity and treatment planning.   Neuroimaging: Brain MRI on 05/04/2020 suggested moderate microvascular disease. Brain MRI on 09/11/2023 suggested moderately advanced microvascular ischemic disease, said to have somewhat progressed relative to her 2021 scan.   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   Primary hypertension 06/22/2015   Stage 3a chronic kidney disease 12/10/2022    Past Surgical History:  Procedure Laterality Date   ABDOMINAL HYSTERECTOMY     vaginal; partial   AUGMENTATION MAMMAPLASTY     COLONOSCOPY WITH PROPOFOL  N/A 02/07/2022   Procedure: COLONOSCOPY WITH PROPOFOL ;  Surgeon: Therisa Bi, MD;  [facility] END[facility];  Service: Gastroenterology;  Laterality: N/A;    Current Outpatient Medications:    amLODipine -benazepril  (LOTREL) 5-10 MG capsule, Take 1 capsule by mouth daily., Disp: 90 capsule, Rfl: 3   cholecalciferol (VITAMIN D) 1000 units tablet, Take 1,000 Units by mouth daily., Disp: , Rfl:    donepezil  (ARICEPT ) 10 MG tablet, Take 1 tablet (10 mg total) by mouth at bedtime., Disp: 30 tablet, Rfl: 11   metroNIDAZOLE  (FLAGYL ) 500 MG tablet, Take 1 tablet (500 mg total) by mouth 2 (two) times daily., Disp: 14 tablet, Rfl: 0   Multiple Vitamins-Minerals (MULTIPLE VITAMINS/WOMENS PO), Take by mouth., Disp: , Rfl:    phenazopyridine  (PYRIDIUM ) 100 MG  tablet, Take 1 tablet (100 mg total) by mouth 3 (three) times daily as needed for pain., Disp: 10 tablet, Rfl: 0  Clinical Interview:   The following information was obtained during a clinical interview with Teresa Dodson and her husband prior to cognitive testing.  Cognitive Symptoms: Decreased short-term memory: Endorsed. Teresa Dodson generally described trouble misplacing or losing track of objects in her home. While she denied trouble recalling details of recent conversations, her husband did express some concern, noting that she may not recall said details a few hours later. He also noted that she has been more prone to repetitive questioning. Symptoms were said to come and go. However, previous medical records suggest concern for progressive decline since 2021.  Decreased long-term memory: Denied. Decreased attention/concentration: Denied. Reduced processing speed: Endorsed. Specifically, she reported feeling not as sharp as I used to be. Difficulties with executive functions: Denied. She and her husband also denied trouble with impulsivity or prominent personality changes.  Difficulties with emotion regulation: Denied. Difficulties with receptive language: Denied. Difficulties with word finding: Denied. Decreased visuoperceptual ability: Denied.  Difficulties completing ADLs: Denied.  Additional Medical History: History of traumatic brain injury/concussion: Denied. History of stroke: Denied. History of seizure activity: Denied. History of known exposure to toxins: Denied. Symptoms of chronic pain: Denied. Experience of frequent headaches/migraines: Denied. Frequent instances of dizziness/vertigo: Denied.  Sensory changes: She wears glasses with benefit. Other sensory changes/difficulties (e.Dodson., hearing, taste, smell) were denied. Her husband noted concerns surrounding a diminished appetite since Teresa Dodson was restarted on donepezil  this past summer. He also described a notably  increased sweet tooth.  Balance/coordination difficulties: Denied. She denied any recent falls.  Other motor difficulties: Denied.  Sleep History: Estimated hours obtained each night: 8 hours.  Difficulties falling asleep: Denied. Difficulties staying asleep: Denied. Feels rested and refreshed upon awakening: Endorsed.  History of snoring: Denied. History of waking up gasping for air: Denied. Witnessed breath cessation while asleep: Denied.  History of vivid dreaming: Denied. Excessive movement while asleep: Denied. Instances of acting out her dreams: Denied.  Psychiatric/Behavioral Health History: Depression: She described her current mood as fine and denied to her knowledge any prior mental health concerns or formal diagnoses. She did allude to significant stress lately stemming from her daughter and four grandchildren recently moving in with her and her husband. Current or remote suicidal ideation, intent, or plan was denied.  Anxiety: Denied. Mania: Denied. Trauma History: Denied. Visual/auditory hallucinations: Denied. Delusional thoughts: Denied.  Tobacco: Denied. Alcohol: She reported infrequent alcohol consumption largely in social settings and denied a history of problematic alcohol abuse or dependence.  Recreational drugs: Denied.  Family History: Problem Relation Age of Onset   Alzheimer's disease Mother        early onset   Cancer Father        lung   Alzheimer's disease Maternal Grandmother    Diabetes Paternal Grandmother    Breast cancer Paternal Aunt    This information was confirmed by Teresa Dodson.  Academic/Vocational History: Highest level of educational attainment: 16 years. She earned a Oncologist in education and described herself as a strong (A/B) student in academic settings. Allison perhaps represented a relative weakness.  History of developmental delay: Denied. History of grade repetition: Denied. Enrollment in special education  courses: Denied. History of LD/ADHD: Denied.  Employment: Retired. She previously worked as a building services engineer.   Evaluation Results:   Behavioral Observations: Ms. Gassner was accompanied by her husband, arrived to her appointment on time, and was appropriately dressed and groomed. She appeared alert. Observed gait and station were within normal limits. Gross motor functioning appeared intact upon informal observation and no abnormal movements (e.Dodson., tremors) were noted. Her affect was generally relaxed and positive. Spontaneous speech was fluent and word finding difficulties were not observed during the clinical interview. Thought processes were coherent, organized, and normal in content. Insight into her cognitive difficulties appeared limited and I have concern that she is unable to appreciate the full extent of ongoing memory impairment.   During testing, sustained attention was appropriate. Task engagement was adequate and she persisted when challenged. Overall, Ms. Vlcek was cooperative with the clinical interview and subsequent testing procedures.   Adequacy of Effort: The validity of neuropsychological testing is limited by the extent to which the individual being tested may be assumed to have exerted adequate effort during testing. Ms. Vicente expressed her intention to perform to the best of her abilities and exhibited adequate task engagement and persistence. Scores across stand-alone and embedded performance validity measures were variable. Raw scores did approach appropriate clinical cut-offs across below expectation performances. While this may suggest cause for mild caution in interpreting current test results, it is my opinion that lower performances are due to true and severe memory impairment rather than variable or suboptimal effort.  Test Results: Ms. Fleishman was poorly oriented at the time of the current evaluation. She incorrectly stated her age (28). She  was also unable to state the current month, date, name of the clinic, and city the clinic was located in [place]).  Intellectual abilities based upon educational and vocational attainment were estimated to be in the average range. Premorbid abilities were estimated to be within the average range based upon a single-word reading test.   Processing speed was average. Basic attention was average. More complex  attention (e.Dodson., working memory) was well below average to below average. Executive functioning was variable, ranging from the exceptionally low to average normative ranges.   Assessed receptive language abilities were exceptionally low. She had greatest difficulty remembering and following multi-step commands, as well as correctly sequencing commands. Ms. Barbara did not appear exhibit prominent difficulties comprehending task instructions and answered all questions asked of her appropriately. Assessed expressive language (e.Dodson., verbal fluency and confrontation naming) was somewhat variable. Phonemic fluency was well below average to below average, semantic fluency was below average, and confrontation naming was average across a screening task but exceptionally low across a more comprehensive task.   Assessed visuospatial/visuoconstructional abilities were variable, ranging from the well below average to average normative ranges.    Learning (i.e., encoding) of novel verbal information was exceptionally low. Spontaneous delayed recall (i.e., retrieval) of previously learned information was also exceptionally low. Retention rates were 0% across a list learning task, 0% across a story learning task, and 0% across a figure drawing task. Performance across recognition tasks was exceptionally low to well below average, suggesting negligible evidence for information consolidation.   Results of emotional screening instruments suggested that recent symptoms of generalized anxiety were in the minimal range,  while symptoms of depression were within normal limits. A screening instrument assessing recent sleep quality suggested the presence of minimal sleep dysfunction.  Tables of Scores:   Note: This summary of test scores accompanies the interpretive report and should not be considered in isolation without reference to the appropriate sections in the text. Descriptors are based on appropriate normative data and may be adjusted based on clinical judgment. Terms such as Within Normal Limits and Outside Normal Limits are used when a more specific description of the test score cannot be determined.       Percentile - Normative Descriptor > 98 - Exceptionally High 91-97 - Well Above Average 75-90 - Above Average 25-74 - Average 9-24 - Below Average 2-8 - Well Below Average < 2 - Exceptionally Low       Validity:   DESCRIPTOR       ACS WC: --- --- Outside Normal Limits  DCT: --- --- Within Normal Limits  RBANS EI: --- --- Outside Normal Limits  WAIS-IV RDS: --- --- Within Normal Limits       Orientation:      Raw Score Percentile   NAB Orientation, Form 1 21/29 --- ---       Cognitive Screening:      Raw Score Percentile   SLUMS: 14/30 --- ---       RBANS, Form A: Standard Score/ Scaled Score Percentile   Total Score 67 1 Exceptionally Low  Immediate Memory 53 <1 Exceptionally Low    List Learning 3 1 Exceptionally Low    Story Memory 2 <1 Exceptionally Low  Visuospatial/Constructional 89 23 Below Average    Figure Copy 11 63 Average    Line Orientation 12/20 3-9 Well Below Average  Language 92 30 Average    Picture Naming 10/10 51-75 Average    Semantic Fluency 7 16 Below Average  Attention 94 34 Average    Digit Span 8 25 Average    Coding 10 50 Average  Delayed Memory 40 <1 Exceptionally Low    List Recall 0/10 <2 Exceptionally Low    List Recognition 11/20 <2 Exceptionally Low    Story Recall 1 <1 Exceptionally Low    Story Recognition 7/12 5-7 Well Below Average     Figure  Recall 1 <1 Exceptionally Low    Figure Recognition 2/8 2-3 Well Below Average        Intellectual Functioning:      Standard Score Percentile   Barona Formula Estimated Premorbid IQ: 102 55 Average        Standard Score Percentile   Test of Premorbid Functioning: 90 25 Average       Attention/Executive Function:     Trail Making Test (TMT): Raw Score (T Score) Percentile     Part A 25 secs.,  0 errors (56) 73 Average    Part B 77 secs.,  0 errors (52) 58 Average         Scaled Score Percentile   WAIS-IV Digit Span: 6 9 Below Average    Forward 8 25 Average    Backward 7 16 Below Average    Sequencing 5 5 Well Below Average        Scaled Score Percentile   WAIS-IV Similarities: 7 16 Below Average       D-KEFS Color-Word Interference Test: Raw Score (Scaled Score) Percentile     Color Naming 30 secs. (11) 63 Average    Word Reading 25 secs. (10) 50 Average    Inhibition 101 secs. (4) 2 Well Below Average      Total Errors 5 errors (6) 9 Below Average    Inhibition/Switching 155 secs. (1) <1 Exceptionally Low      Total Errors 20 errors (1) <1 Exceptionally Low       D-KEFS Verbal Fluency Test: Raw Score (Scaled Score) Percentile     Letter Total Correct 25 (7) 16 Below Average    Category Total Correct 25 (6) 9 Below Average    Category Switching Total Correct 9 (6) 9 Below Average    Category Switching Accuracy 8 (7) 16 Below Average      Total Set Loss Errors 3 (9) 37 Average      Total Repetition Errors 4 (9) 37 Average       [place]  Card Sorting Test: Raw Score Percentile     Categories (trials) 1 (64) 6-10 Well Below Average    Total Errors 35 3 Well Below Average    Perseverative Errors 11 18 Below Average    Non-Perseverative Errors 24 <1 Exceptionally Low    Failure to Maintain Set 0 --- ---       Language:     Verbal Fluency Test: Raw Score (T Score) Percentile     Phonemic Fluency (FAS) 25 (36) 8 Well Below Average    Animal Fluency 12 (39) 14  Below Average        NAB Language Module, Form 1: T Score Percentile     Auditory Comprehension 19 <1 Exceptionally Low    Naming 26/31 (24) <1 Exceptionally Low       Visuospatial/Visuoconstruction:      Raw Score Percentile   Clock Drawing: 9/10 --- Within Normal Limits        Scaled Score Percentile   WAIS-IV Block Design: 7 16 Below Average       Mood and Personality:      Raw Score Percentile   Beck Depression Inventory - II: 1 --- Within Normal Limits  PROMIS Anxiety Questionnaire: 7 --- None to Slight       Additional Questionnaires:      Raw Score Percentile   PROMIS Sleep Disturbance Questionnaire: 8 --- None to Slight   Informed Consent and Coding/Compliance:   The current evaluation represents  a clinical evaluation for the purposes previously outlined by the referral source and is in no way reflective of a forensic evaluation.   Ms. Hoelzel was provided with a verbal description of the nature and purpose of the present neuropsychological evaluation. Also reviewed were the foreseeable risks and/or discomforts and benefits of the procedure, limits of confidentiality, and mandatory reporting requirements of this provider. The patient was given the opportunity to ask questions and receive answers about the evaluation. Oral consent to participate was provided by the patient.   This evaluation was conducted by Arthea KYM Maryland, Ph.D., ABPP-CN, board certified clinical neuropsychologist. Ms. Reder completed a clinical interview with Dr. Maryland, billed as one unit [phone], and 125 minutes of cognitive testing and scoring, billed as one unit [phone] and three additional units [id number]. Psychometrist Luke Pitcher, B.S. assisted Dr. Maryland with test administration and scoring procedures. As a separate and discrete service, one unit [id number] and two units [phone] were billed for Dr. Loralee time spent in interpretation and report writing.

## 2023-11-06 NOTE — Progress Notes (Signed)
   Psychometrician Note   Cognitive testing was administered to Teresa Dodson by Luke Pitcher, B.S. (psychometrist) under the supervision of Dr. Arthea KYM Maryland, Ph.D., licensed psychologist on 11/06/2023. Teresa Dodson did not appear overtly distressed by the testing session per behavioral observation or responses across self-report questionnaires. Rest breaks were offered.    The battery of tests administered was selected by Dr. Zachary C. Merz, Ph.D. with consideration to Teresa Dodson's current level of functioning, the nature of her symptoms, emotional and behavioral responses during interview, level of literacy, observed level of motivation/effort, and the nature of the referral question. This battery was communicated to the psychometrist. Communication between Dr. Arthea KYM Maryland, Ph.D. and the psychometrist was ongoing throughout the evaluation and Dr. Arthea KYM Maryland, Ph.D. was immediately accessible at all times. Dr. Arthea KYM Maryland, Ph.D. provided supervision to the psychometrist on the date of this service to the extent necessary to assure the quality of all services provided.    Teresa Dodson will return within approximately 1-2 weeks for an interactive feedback session with Dr. Maryland at which time her test performances, clinical impressions, and treatment recommendations will be reviewed in detail. Teresa Dodson understands she can contact our office should she require our assistance before this time.  A total of 125 minutes of billable time were spent face-to-face with Teresa Dodson by the psychometrist. This includes both test administration and scoring time. Billing for these services is reflected in the clinical report generated by Dr. Arthea KYM Maryland, Ph.D.  This note reflects time spent with the psychometrician and does not include test scores or any clinical interpretations made by Dr. Maryland. The full report will follow in a separate note.

## 2023-11-14 ENCOUNTER — Ambulatory Visit (INDEPENDENT_AMBULATORY_CARE_PROVIDER_SITE_OTHER): Payer: Medicaid Other | Admitting: Psychology

## 2023-11-14 DIAGNOSIS — G3184 Mild cognitive impairment, so stated: Secondary | ICD-10-CM | POA: Diagnosis not present

## 2023-11-14 NOTE — Progress Notes (Signed)
   Neuropsychology Feedback Session Jolynn DEL. [facility] Department of Neurology  Reason for Referral:   Kanijah Groseclose is a 63 y.o. right-handed African-American female referred by Camie Sevin, PA-C, to characterize her current cognitive functioning and assist with diagnostic clarity and treatment planning in the context of subjective cognitive decline and a positive family history of Alzheimer's disease.   Feedback:   Ms. Kestner completed a comprehensive neuropsychological evaluation on 11/06/2023. Please refer to that encounter for the full report and recommendations. Briefly, results suggested severe impairment surrounding all aspects of learning and memory. A normative impairment was also exhibited across receptive language, likely due to memory impairment rather than frank comprehension difficulties. Relative weaknesses were exhibited across complex attention and verbal fluency while performance variability was exhibited across executive functioning, confrontation naming, and visuospatial abilities. While the cause of her memory impairment is somewhat uncertain, I do have concerns for an underlying neurodegenerative illness, namely Alzheimer's disease. Ms. Kozicki was fully amnestic (i.e. 0% retention) across all memory tasks after a brief delay and performed quite poorly across yes/no recognition trials. This suggests rapid forgetting and a pronounced storage impairment, both of which are the hallmark testing patterns of this illness. Further variability/weakness surrounding confrontation naming would follow typical disease trajectory. Symptomatic Alzheimer's disease in a 63 year old individual would be fairly rare. However, there does appear to be a family history (i.e., her mother) of an early-onset presentation, likely elevating Ms. Springborn's overall risk. 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 Continued medical monitoring will be important moving forward.   Ms. Veldhuizen was accompanied by her husband during the current feedback session. Content of the current session focused on the results of her neuropsychological evaluation. Ms. Hakim was given the opportunity to ask questions and her questions were answered. She was encouraged to reach out should additional questions arise. A copy of her report was provided at the conclusion of the visit.      One unit [phone] was billed for Dr. Loralee time spent preparing for, conducting, and documenting the current feedback session with Ms. Nave.

## 2023-11-19 ENCOUNTER — Ambulatory Visit: Payer: 59 | Admitting: Physician Assistant

## 2023-11-20 ENCOUNTER — Encounter: Payer: Self-pay | Admitting: Physician Assistant

## 2023-11-20 ENCOUNTER — Ambulatory Visit: Payer: 59 | Admitting: Physician Assistant

## 2023-11-20 VITALS — BP 136/82 | HR 63 | Resp 16 | Ht 68.0 in | Wt 125.0 lb

## 2023-11-20 DIAGNOSIS — I10 Essential (primary) hypertension: Secondary | ICD-10-CM | POA: Diagnosis not present

## 2023-11-20 DIAGNOSIS — E785 Hyperlipidemia, unspecified: Secondary | ICD-10-CM | POA: Diagnosis not present

## 2023-11-20 DIAGNOSIS — R413 Other amnesia: Secondary | ICD-10-CM

## 2023-11-20 DIAGNOSIS — N1831 Chronic kidney disease, stage 3a: Secondary | ICD-10-CM

## 2023-11-20 NOTE — Progress Notes (Signed)
Established Patient Office Visit  Name: Teresa Dodson   MRN: [id number]    DOB: [birth year]   Date:11/20/2023  Today's Provider: Jacquelin Hawking, MHS, PA-C Introduced myself to the patient as a PA-C and provided education on APPs in clinical practice.         Subjective  Chief Complaint  Chief Complaint  Patient presents with   Medical Management of Chronic Issues    3 months   Hypertension    HPI  HYPERTENSION / HYPERLIPIDEMIA Satisfied with current treatment? 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    Past Surgical History:  Procedure Laterality Date   ABDOMINAL HYSTERECTOMY     vaginal; partial   AUGMENTATION MAMMAPLASTY     COLONOSCOPY WITH PROPOFOL N/A 02/07/2022   Procedure: COLONOSCOPY WITH PROPOFOL;  Surgeon: Wyline Mood, MD;  [facility] ENDOSCOPY;  Service: Gastroenterology;  Laterality: N/A;    Family History  Problem Relation Age of Onset   Alzheimer's disease Mother        early onset   Cancer Father        lung   Alzheimer's disease Maternal Grandmother     Diabetes Paternal Grandmother    Breast cancer Paternal Aunt     Social History   Tobacco Use   Smoking status: Never   Smokeless tobacco: Never  Substance Use Topics   Alcohol use: Not Currently    Comment: socially     Current Outpatient Medications:    amLODipine-benazepril (LOTREL) 5-10 MG capsule, Take 1 capsule by mouth daily., Disp: 90 capsule, Rfl: 3   cholecalciferol (VITAMIN D) 1000 units tablet, Take 1,000 Units by mouth daily., Disp: , Rfl:    donepezil (ARICEPT) 10 MG tablet, Take 1 tablet (10 mg total) by mouth at bedtime., Disp: 30 tablet, Rfl: 11   Multiple Vitamins-Minerals (MULTIPLE VITAMINS/WOMENS PO), Take by mouth., Disp: , Rfl:   Allergies  Allergen Reactions   Sulfamethoxazole-Trimethoprim Rash    I personally reviewed active problem list, medication list, allergies, notes from last encounter, lab results with the patient/caregiver today.   Review of Systems  Eyes:  Negative for blurred vision and double vision.  Respiratory:  Negative for shortness of breath.   Cardiovascular:  Negative for chest pain, palpitations and leg swelling.  Neurological:  Negative for dizziness and headaches.  Psychiatric/Behavioral:  Negative for depression and memory loss. The patient is not nervous/anxious and does not have insomnia.       Objective  Vitals:   11/20/23 1000  BP: 136/82  Pulse: 63  Resp: 16  SpO2: 96%  Weight: 125 lb (56.7 kg)  Height: 5\' 8"  (1.727 m)    Body  mass index is 19.01 kg/m.  Physical Exam Vitals reviewed.  Constitutional:      General: She is awake.     Appearance: Normal appearance. She is well-developed and well-groomed.  HENT:     Head: Normocephalic and atraumatic.  Cardiovascular:     Rate and Rhythm: Normal rate and regular rhythm.     Pulses: Normal pulses.          Radial pulses are 2+ on the right side and 2+ on the left side.     Heart sounds: Normal heart sounds. No murmur heard.    No friction rub. No gallop.   Pulmonary:     Effort: Pulmonary effort is normal.     Breath sounds: Normal breath sounds. No decreased air movement. No decreased breath sounds, wheezing, rhonchi or rales.  Musculoskeletal:     Cervical back: Normal range of motion.     Right lower leg: No edema.     Left lower leg: No edema.  Neurological:     General: No focal deficit present.     Mental Status: She is alert and oriented to person, place, and time. Mental status is at baseline.     GCS: GCS eye subscore is 4. GCS verbal subscore is 5. GCS motor subscore is 6.  Psychiatric:        Attention and Perception: Attention and perception normal.        Mood and Affect: Mood and affect normal.        Speech: Speech normal.        Behavior: Behavior normal. Behavior is cooperative.        Thought Content: Thought content normal.        Cognition and Memory: Cognition normal.      Recent Results (from the past 2160 hours)  POCT urinalysis dipstick     Status: Abnormal   Collection Time: 08/26/23 11:39 AM  Result Value Ref Range   Color, UA Pink    Clarity, UA clear    Glucose, UA Negative Negative   Bilirubin, UA Small    Ketones, UA Small    Spec Grav, UA 1.015 1.010 - 1.025   Blood, UA Large    pH, UA 6.0 5.0 - 8.0   Protein, UA Positive (A) Negative   Urobilinogen, UA 0.2 0.2 or 1.0 E.U./dL   Nitrite, UA Negative    Leukocytes, UA Moderate (2+) (A) Negative   Appearance Abnormal    Odor No   Urine Culture     Status: None   Collection Time: 08/26/23  1:40 PM   Specimen: Urine  Result Value Ref Range   MICRO NUMBER: [id number]    SPECIMEN QUALITY: Adequate    Sample Source URINE    STATUS: FINAL    Result: No Growth   Urine Microscopic     Status: Abnormal   Collection Time: 08/26/23  1:40 PM  Result Value Ref Range   WBC, UA 20-40 (A) 0 - 5 /HPF   RBC / HPF 20-40 (A) 0 - 2 /HPF   Squamous Epithelial / HPF 20-40 (A) < OR = 5 /HPF   Bacteria, UA MANY (A) NONE SEEN /HPF   Hyaline Cast NONE SEEN NONE  SEEN /LPF   Yeast FEW (A) NONE SEEN /HPF   Note      Comment: This urine was analyzed for the presence of WBC,  RBC, bacteria, casts, and other formed elements.  Only those elements seen were reported. Marland Kitchen Marland Kitchen  Cervicovaginal ancillary only     Status: Abnormal   Collection Time: 09/16/23 10:18 AM  Result Value Ref Range   Trichomonas Negative    Bacterial Vaginitis (gardnerella) Positive (A)    Candida Vaginitis Negative    Candida Glabrata Negative    Comment      Normal Reference Range Bacterial Vaginosis - Negative   Comment Normal Reference Range Candida Species - Negative    Comment Normal Reference Range Candida Galbrata - Negative    Comment Normal Reference Range Trichomonas - Negative   Urine Culture     Status: None   Collection Time: 09/16/23 11:27 AM   Specimen: Urine  Result Value Ref Range   MICRO NUMBER: [id number]    SPECIMEN QUALITY: Adequate    Sample Source URINE    STATUS: FINAL    Result: No Growth   Lipid panel     Status: Abnormal   Collection Time: 11/20/23 10:40 AM  Result Value Ref Range   Cholesterol 206 (H) <200 mg/dL   HDL 81 > OR = 50 mg/dL   Triglycerides 78 <841 mg/dL   LDL Cholesterol (Calc) 108 (H) mg/dL (calc)    Comment: Reference range: <100 . Desirable range <100 mg/dL for primary prevention;   <70 mg/dL for patients with CHD or diabetic patients  with > or = 2 CHD risk factors. Marland Kitchen LDL-C is now calculated using the Martin-Hopkins  calculation, which is a validated novel method providing  better accuracy than the Friedewald equation in the  estimation of LDL-C.  Horald Pollen et al. Lenox Ahr. 3244;010(27): 2061-2068  (http://education.[employer].com/faq/FAQ164)    Total CHOL/HDL Ratio 2.5 <5.0 (calc)   Non-HDL Cholesterol (Calc) 125 <130 mg/dL (calc)    Comment: For patients with diabetes plus 1 major ASCVD risk  factor, treating to a non-HDL-C goal of <100 mg/dL  (LDL-C of <25 mg/dL) is considered a therapeutic  option.   CBC  with Differential/Platelet     Status: None   Collection Time: 11/20/23 10:40 AM  Result Value Ref Range   WBC 5.3 3.8 - 10.8 Thousand/uL   RBC 4.39 3.80 - 5.10 Million/uL   Hemoglobin 13.2 11.7 - 15.5 g/dL   HCT 36.6 44.0 - 34.7 %   MCV 90.7 80.0 - 100.0 fL   MCH 30.1 27.0 - 33.0 pg   MCHC 33.2 32.0 - 36.0 g/dL    Comment: For adults, a slight decrease in the calculated MCHC value (in the range of 30 to 32 g/dL) is most likely not clinically significant; however, it should be interpreted with caution in correlation with other red cell parameters and the patient's clinical condition.    RDW 12.7 11.0 - 15.0 %   Platelets 265 140 - 400 Thousand/uL   MPV 10.3 7.5 - 12.5 fL   Neutro Abs 3,562 1,500 - 7,800 cells/uL   Absolute Lymphocytes 1,214 850 - 3,900 cells/uL   Absolute Monocytes 392 200 - 950 cells/uL   Eosinophils Absolute 101 15 - 500 cells/uL   Basophils Absolute 32 0 - 200 cells/uL   Neutrophils Relative % 67.2 %   Total Lymphocyte 22.9 %   Monocytes Relative 7.4 %   Eosinophils Relative 1.9 %   Basophils Relative 0.6 %  COMPLETE METABOLIC PANEL WITH GFR     Status: None   Collection Time: 11/20/23 10:40 AM  Result Value Ref Range   Glucose, Bld 65 65 - 99 mg/dL    Comment: .  Fasting reference interval .    BUN 17 7 - 25 mg/dL   Creat 5.42 7.06 - 2.37 mg/dL   eGFR 60 > OR = 60 SE/GBT/5.17O1   BUN/Creatinine Ratio SEE NOTE: 6 - 22 (calc)    Comment:    Not Reported: BUN and Creatinine are within    reference range. .    Sodium 143 135 - 146 mmol/L   Potassium 3.9 3.5 - 5.3 mmol/L   Chloride 105 98 - 110 mmol/L   CO2 29 20 - 32 mmol/L   Calcium 9.5 8.6 - 10.4 mg/dL   Total Protein 6.8 6.1 - 8.1 g/dL   Albumin 4.3 3.6 - 5.1 g/dL   Globulin 2.5 1.9 - 3.7 g/dL (calc)   AG Ratio 1.7 1.0 - 2.5 (calc)   Total Bilirubin 0.5 0.2 - 1.2 mg/dL   Alkaline phosphatase (APISO) 50 37 - 153 U/L   AST 24 10 - 35 U/L   ALT 20 6 - 29 U/L     PHQ2/9:     09/16/2023    9:48 AM 08/26/2023   11:38 AM 08/19/2023   11:16 AM 06/10/2023    1:36 PM 12/10/2022    1:25 PM  Depression screen PHQ 2/9  Decreased Interest 0 0 0 0 0  Down, Depressed, Hopeless 0 0 0 0 0  PHQ - 2 Score 0 0 0 0 0  Altered sleeping 0  0 0 0  Tired, decreased energy 0  0 0 0  Change in appetite 0  0 0 0  Feeling bad or failure about yourself  0  0 0 0  Trouble concentrating 0  0 0 0  Moving slowly or fidgety/restless 0  0 0 0  Suicidal thoughts 0  0 0 0  PHQ-9 Score 0  0 0 0  Difficult doing work/chores Not difficult at all  Not difficult at all Not difficult at all Not difficult at all      Fall Risk:    09/16/2023    9:47 AM 09/11/2023    9:11 AM 08/26/2023   11:38 AM 08/19/2023   11:16 AM 07/22/2023    9:49 AM  Fall Risk   Falls in the past year? 0 0 0 0 0  Number falls in past yr: 0 0  0 0  Injury with Fall? 0 0  0 0  Risk for fall due to : No Fall Risks  No Fall Risks No Fall Risks   Follow up Falls prevention discussed;Education provided;Falls evaluation completed Falls evaluation completed Falls prevention discussed Falls prevention discussed;Education provided;Falls evaluation completed Falls evaluation completed      12/10/2022    1:37 PM  MMSE - Mini Mental State Exam  Orientation to time 2  Orientation to Place 3  Registration 3  Attention/ Calculation 5  Recall 0  Language- name 2 objects 2  Language- repeat 1  Language- follow 3 step command 3  Language- read & follow direction 1  Write a sentence 1  Copy design 1  Total score 22        Functional Status Survey:      Assessment & Plan  Problem List Items Addressed This Visit       Cardiovascular and Mediastinum   Primary hypertension - Primary   Chronic, historic condition Appears to be well-managed on current regimen comprised of amlodipine-Benazepril 5-10 mg p.o. daily Continue current regimen Recommend checking blood pressure at home several times per week for  monitoring Follow-up  in 6 months or sooner if concerns arise      Relevant Orders   CBC with Differential/Platelet (Completed)   COMPLETE METABOLIC PANEL WITH GFR (Completed)     Genitourinary   Stage 3a chronic kidney disease   Recheck CMP Results to dictate further management      Relevant Orders   COMPLETE METABOLIC PANEL WITH GFR (Completed)     Other   Hyperlipidemia   Chronic, historic condition The 10-year ASCVD risk score (Arnett DK, et al., 2019) is: 8.5% Recheck lipid panel today Not currently on medications Results of labs to dictate further management Follow up in 6 months or sooner if concerns arise        Relevant Orders   Lipid panel (Completed)     Return in about 6 months (around 05/19/2024) for HLD, HTN, memory concerns .   I, Tatumn Corbridge E Towanna Avery, PA-C, have reviewed all documentation for this visit. The documentation on 11/21/23 for the exam, diagnosis, procedures, and orders are all accurate and complete.   Jacquelin Hawking, MHS, PA-C [facility]

## 2023-11-21 LAB — LIPID PANEL
Cholesterol: 206 mg/dL — ABNORMAL HIGH (ref <200)
HDL: 81 mg/dL (ref >=50)
LDL Cholesterol (Calc): 108 mg/dL — ABNORMAL HIGH
Non-HDL Cholesterol (Calc): 125 mg/dL (ref <130)
Total CHOL/HDL Ratio: 2.5 (calc) (ref <5.0)
Triglycerides: 78 mg/dL (ref <150)

## 2023-11-21 LAB — COMPLETE METABOLIC PANEL WITH GFR
AG Ratio: 1.7 (calc) (ref 1.0–2.5)
ALT: 20 U/L (ref 6–29)
AST: 24 U/L (ref 10–35)
Albumin: 4.3 g/dL (ref 3.6–5.1)
Alkaline phosphatase (APISO): 50 U/L (ref 37–153)
BUN: 17 mg/dL (ref 7–25)
CO2: 29 mmol/L (ref 20–32)
Calcium: 9.5 mg/dL (ref 8.6–10.4)
Chloride: 105 mmol/L (ref 98–110)
Creat: 1.05 mg/dL (ref 0.50–1.05)
Globulin: 2.5 g/dL (ref 1.9–3.7)
Glucose, Bld: 65 mg/dL (ref 65–99)
Potassium: 3.9 mmol/L (ref 3.5–5.3)
Sodium: 143 mmol/L (ref 135–146)
Total Bilirubin: 0.5 mg/dL (ref 0.2–1.2)
Total Protein: 6.8 g/dL (ref 6.1–8.1)
eGFR: 60 mL/min/{1.73_m2} (ref >=60)

## 2023-11-21 LAB — CBC WITH DIFFERENTIAL/PLATELET
Absolute Lymphocytes: 1214 {cells}/uL (ref 850–3900)
Absolute Monocytes: 392 {cells}/uL (ref 200–950)
Basophils Absolute: 32 {cells}/uL (ref 0–200)
Basophils Relative: 0.6 %
Eosinophils Absolute: 101 {cells}/uL (ref 15–500)
Eosinophils Relative: 1.9 %
HCT: 39.8 % (ref 35.0–45.0)
Hemoglobin: 13.2 g/dL (ref 11.7–15.5)
MCH: 30.1 pg (ref 27.0–33.0)
MCHC: 33.2 g/dL (ref 32.0–36.0)
MCV: 90.7 fL (ref 80.0–100.0)
MPV: 10.3 fL (ref 7.5–12.5)
Monocytes Relative: 7.4 %
Neutro Abs: 3562 {cells}/uL (ref 1500–7800)
Neutrophils Relative %: 67.2 %
Platelets: 265 10*3/uL (ref 140–400)
RBC: 4.39 10*6/uL (ref 3.80–5.10)
RDW: 12.7 % (ref 11.0–15.0)
Total Lymphocyte: 22.9 %
WBC: 5.3 10*3/uL (ref 3.8–10.8)

## 2023-11-21 NOTE — Assessment & Plan Note (Signed)
Chronic, historic condition Appears to be well-managed on current regimen comprised of amlodipine-Benazepril 5-10 mg p.o. daily Continue current regimen Recommend checking blood pressure at home several times per week for monitoring Follow-up in 6 months or sooner if concerns arise

## 2023-11-21 NOTE — Assessment & Plan Note (Signed)
Chronic, historic condition The 10-year ASCVD risk score (Arnett DK, et al., 2019) is: 8.5% Recheck lipid panel today Not currently on medications Results of labs to dictate further management Follow up in 6 months or sooner if concerns arise

## 2023-11-21 NOTE — Assessment & Plan Note (Signed)
Recheck CMP Results to dictate further management

## 2023-11-25 ENCOUNTER — Encounter: Payer: Self-pay | Admitting: Physician Assistant

## 2023-11-25 NOTE — Progress Notes (Signed)
Your labs are back Your cholesterol is improving since it was checked about a year ago. Good job.  Your CBC is overall normal- no signs of anemia Your electrolytes, liver and kidney function were overall normal at this time

## 2023-12-23 ENCOUNTER — Encounter: Payer: Self-pay | Admitting: Physician Assistant

## 2023-12-23 ENCOUNTER — Other Ambulatory Visit: Payer: Self-pay

## 2023-12-23 MED ORDER — DONEPEZIL HCL 10 MG PO TABS: 10.0000 mg | ORAL_TABLET | Freq: Every day | ORAL | 3 refills | Status: AC

## 2024-03-02 ENCOUNTER — Telehealth: Payer: Self-pay | Admitting: Family Medicine

## 2024-03-02 DIAGNOSIS — I10 Essential (primary) hypertension: Secondary | ICD-10-CM

## 2024-03-02 MED ORDER — AMLODIPINE BESY-BENAZEPRIL HCL 5-10 MG PO CAPS: 1.0000 | ORAL_CAPSULE | Freq: Every day | ORAL | 5 refills | Status: DC

## 2024-03-02 NOTE — Telephone Encounter (Signed)
 Pt went on a trip and counted out the pills (amlodipine  5-10mg ) but when she got back home she realized she was short. They called the pharmacy and the pharmacy said they can refill the medication on this coming Friday. However, pt missed a dose on last night and will not be able to take any until the refill on Friday.   I asked the patient about the 90 day supply with the 3 refills and she stated that they have never received 90 day supply it is always 30 day supply.   Question is it okay for her to go without until Friday if not are you able to push the refill before Friday  Call husband [phone]

## 2024-03-02 NOTE — Telephone Encounter (Signed)
Called pharmacy and made aware.

## 2024-03-02 NOTE — Addendum Note (Signed)
 Addended by: Ailene Royal on: 03/02/2024 02:57 PM   Modules accepted: Orders

## 2024-03-11 ENCOUNTER — Ambulatory Visit: Payer: 59 | Admitting: Physician Assistant

## 2024-03-24 ENCOUNTER — Encounter: Payer: Self-pay | Admitting: Physician Assistant

## 2024-03-24 ENCOUNTER — Encounter: Payer: Self-pay | Admitting: Family Medicine

## 2024-04-07 ENCOUNTER — Ambulatory Visit: Admitting: Family Medicine

## 2024-04-07 NOTE — Progress Notes (Deleted)
 Patient ID: Teresa Dodson, female    DOB: 19-Aug-1961, 63 y.o.   MRN: [id number]  PCP: Adeline Hone, PA-C  No chief complaint on file.   Subjective:   Teresa Dodson is a 63 y.o. 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      Current Outpatient Medications:    amLODipine -benazepril  (LOTREL) 5-10 MG capsule, Take 1 capsule by mouth daily., Disp: 30 capsule, Rfl: 5   cholecalciferol (VITAMIN D) 1000 units tablet, Take 1,000 Units by mouth daily., Disp: , Rfl:    donepezil  (ARICEPT ) 10 MG tablet, Take 1 tablet (10 mg total) by mouth at bedtime., Disp: 30 tablet, Rfl: 11   donepezil  (ARICEPT ) 10 MG tablet, Take 1 tablet (10 mg total) by mouth at bedtime., Disp: 30 tablet, Rfl: 3   Multiple Vitamins-Minerals (MULTIPLE VITAMINS/WOMENS PO), Take by mouth., Disp: , Rfl:    Allergies  Allergen Reactions   Sulfamethoxazole-Trimethoprim Rash     Social History   Tobacco Use   Smoking status: Never   Smokeless tobacco: Never  Vaping Use   Vaping status: Never Used   Substance Use Topics   Alcohol use: Not Currently    Comment: socially   Drug use: No      Chart Review Today: ***  Review of Systems     Objective:   There were no vitals filed for this visit.  There is no height or weight on file to calculate BMI.  Physical Exam   Results for orders placed or performed in visit on 11/20/23  Lipid panel   Collection Time: 11/20/23 10:40 AM  Result Value Ref Range   Cholesterol 206 (H) <200 mg/dL   HDL 81 > OR = 50 mg/dL   Triglycerides 78 <829 mg/dL   LDL Cholesterol (Calc) 108 (H) mg/dL (calc)   Total CHOL/HDL Ratio 2.5 <5.0 (calc)   Non-HDL Cholesterol (Calc) 125 <130 mg/dL (calc)  CBC with Differential/Platelet   Collection Time: 11/20/23 10:40 AM  Result Value Ref Range   WBC 5.3 3.8 - 10.8 Thousand/uL   RBC 4.39 3.80 - 5.10 Million/uL   Hemoglobin 13.2 11.7 - 15.5 g/dL   HCT 56.2 13.0 - 86.5 %   MCV 90.7 80.0 - 100.0 fL   MCH 30.1 27.0 - 33.0 pg   MCHC 33.2 32.0 - 36.0 g/dL   RDW 78.4 69.6 - 29.5 %   Platelets 265 140 - 400 Thousand/uL   MPV 10.3 7.5 - 12.5 fL   Neutro Abs 3,562 1,500 - 7,800 cells/uL  Absolute Lymphocytes 1,214 850 - 3,900 cells/uL   Absolute Monocytes 392 200 - 950 cells/uL   Eosinophils Absolute 101 15 - 500 cells/uL   Basophils Absolute 32 0 - 200 cells/uL   Neutrophils Relative % 67.2 %   Total Lymphocyte 22.9 %   Monocytes Relative 7.4 %   Eosinophils Relative 1.9 %   Basophils Relative 0.6 %  COMPLETE METABOLIC PANEL WITH GFR   Collection Time: 11/20/23 10:40 AM  Result Value Ref Range   Glucose, Bld 65 65 - 99 mg/dL   BUN 17 7 - 25 mg/dL   Creat 1.61 0.96 - 0.45 mg/dL   eGFR 60 > OR = 60 WU/JWJ/1.91Y7   BUN/Creatinine Ratio SEE NOTE: 6 - 22 (calc)   Sodium 143 135 - 146 mmol/L   Potassium 3.9 3.5 - 5.3 mmol/L   Chloride 105 98 - 110 mmol/L   CO2 29 20 - 32 mmol/L   Calcium 9.5 8.6 - 10.4 mg/dL   Total Protein 6.8 6.1 - 8.1 g/dL   Albumin 4.3 3.6 - 5.1 g/dL   Globulin 2.5 1.9 - 3.7  g/dL (calc)   AG Ratio 1.7 1.0 - 2.5 (calc)   Total Bilirubin 0.5 0.2 - 1.2 mg/dL   Alkaline phosphatase (APISO) 50 37 - 153 U/L   AST 24 10 - 35 U/L   ALT 20 6 - 29 U/L       Assessment & Plan:   ***     Adeline Hone, PA-C 04/07/24 12:56 PM

## 2024-04-08 ENCOUNTER — Ambulatory Visit (INDEPENDENT_AMBULATORY_CARE_PROVIDER_SITE_OTHER): Admitting: Family Medicine

## 2024-04-08 ENCOUNTER — Encounter: Payer: Self-pay | Admitting: Family Medicine

## 2024-04-08 VITALS — BP 118/70 | HR 60 | Resp 16 | Ht 68.0 in | Wt 128.0 lb

## 2024-04-08 DIAGNOSIS — I10 Essential (primary) hypertension: Secondary | ICD-10-CM

## 2024-04-08 DIAGNOSIS — R21 Rash and other nonspecific skin eruption: Secondary | ICD-10-CM | POA: Diagnosis not present

## 2024-04-08 DIAGNOSIS — G3184 Mild cognitive impairment, so stated: Secondary | ICD-10-CM | POA: Diagnosis not present

## 2024-04-08 DIAGNOSIS — Z820 Family history of epilepsy and other diseases of the nervous system: Secondary | ICD-10-CM

## 2024-04-08 DIAGNOSIS — Z1231 Encounter for screening mammogram for malignant neoplasm of breast: Secondary | ICD-10-CM

## 2024-04-08 NOTE — Progress Notes (Signed)
 Patient ID: Teresa Dodson, female    DOB: 1961/04/08, 63 y.o.   MRN: [id number]  PCP: Adeline Hone, PA-C  Chief Complaint  Patient presents with   Consult    Discuss liver testing   Skin Discoloration    L side of jaw mostly, x1 week. No itching and pain    Subjective:   Teresa Dodson is a 63 y.o. female, presents to clinic with CC of the following:  HPI  Here with husband - he had been sending mychart msg about liver health and cirrhosis/fibrosis and liver testing after reading an article about [employer] veterans who were dx with dementia and about 10% had unknown cirrhosis.  He was being proactive to see if that could possibly be related to her MCI and if there was anything that they could test for to help prevent any contributing factors that may worsen dx She is on aricept  Has consulted with neurology  Reviewed pts years of labs, no elevated of LFTs, currently no supplements or meds that would injury liver, rarely uses tylenol , no ETOH She does have poor diet, likes candy/sweets, cholesterol mildly elevated  Rash/spots to left side of face, hyperpigmented, slightly raised and dry, not itchy, she started to use vaseline and it has improved.    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      Current Outpatient Medications:    amLODipine -benazepril  (LOTREL) 5-10 MG capsule, Take 1 capsule by mouth daily., Disp: 30  capsule, Rfl: 5   cholecalciferol (VITAMIN D) 1000 units tablet, Take 1,000 Units by mouth daily., Disp: , Rfl:    donepezil  (ARICEPT ) 10 MG tablet, Take 1 tablet (10 mg total) by mouth at bedtime., Disp: 30 tablet, Rfl: 3   Multiple Vitamins-Minerals (MULTIPLE VITAMINS/WOMENS PO), Take by mouth., Disp: , Rfl:    Allergies  Allergen Reactions   Sulfamethoxazole-Trimethoprim Rash     Social History   Tobacco Use   Smoking status: Never   Smokeless tobacco: Never  Vaping Use   Vaping status: Never Used  Substance Use Topics   Alcohol use: Not Currently    Comment: socially   Drug use: No      Chart Review Today: I personally reviewed active problem list, medication list, allergies, family history, social history, health maintenance, notes from last encounter, lab results, imaging with the patient/caregiver today.   Review of Systems  All other systems reviewed and are negative.      Objective:   Vitals:   04/08/24 1123  BP: 118/70  Pulse: 60  Resp: 16  SpO2: 99%  Weight: 128 lb (58.1 kg)  Height: 5\' 8"  (1.727 m)    Body mass index is 19.46 kg/m.  Physical Exam Vitals and nursing note reviewed.  Constitutional:      General: She is not in acute distress.    Appearance: Normal appearance. She is well-developed, well-groomed and normal weight. She is not ill-appearing, toxic-appearing or diaphoretic.  HENT:  Head: Normocephalic and atraumatic.     Right Ear: External ear normal.     Left Ear: External ear normal.     Nose: Nose normal.  Eyes:     General: No scleral icterus.       Right eye: No discharge.        Left eye: No discharge.     Conjunctiva/sclera: Conjunctivae normal.  Neck:     Trachea: No tracheal deviation.  Cardiovascular:     Rate and Rhythm: Normal rate and regular rhythm.     Pulses: Normal pulses.     Heart sounds: Normal heart sounds.  Pulmonary:     Effort: Pulmonary effort is normal. No respiratory distress.     Breath  sounds: Normal breath sounds. No stridor.  Skin:    General: Skin is warm and dry.     Findings: No rash.  Neurological:     Mental Status: She is alert.     Motor: No abnormal muscle tone.     Coordination: Coordination normal.     Gait: Gait normal.  Psychiatric:        Mood and Affect: Mood normal.        Behavior: Behavior normal. Behavior is cooperative.      Results for orders placed or performed in visit on 11/20/23  Lipid panel   Collection Time: 11/20/23 10:40 AM  Result Value Ref Range   Cholesterol 206 (H) <200 mg/dL   HDL 81 > OR = 50 mg/dL   Triglycerides 78 <213 mg/dL   LDL Cholesterol (Calc) 108 (H) mg/dL (calc)   Total CHOL/HDL Ratio 2.5 <5.0 (calc)   Non-HDL Cholesterol (Calc) 125 <130 mg/dL (calc)  CBC with Differential/Platelet   Collection Time: 11/20/23 10:40 AM  Result Value Ref Range   WBC 5.3 3.8 - 10.8 Thousand/uL   RBC 4.39 3.80 - 5.10 Million/uL   Hemoglobin 13.2 11.7 - 15.5 g/dL   HCT 08.6 57.8 - 46.9 %   MCV 90.7 80.0 - 100.0 fL   MCH 30.1 27.0 - 33.0 pg   MCHC 33.2 32.0 - 36.0 g/dL   RDW 62.9 52.8 - 41.3 %   Platelets 265 140 - 400 Thousand/uL   MPV 10.3 7.5 - 12.5 fL   Neutro Abs 3,562 1,500 - 7,800 cells/uL   Absolute Lymphocytes 1,214 850 - 3,900 cells/uL   Absolute Monocytes 392 200 - 950 cells/uL   Eosinophils Absolute 101 15 - 500 cells/uL   Basophils Absolute 32 0 - 200 cells/uL   Neutrophils Relative % 67.2 %   Total Lymphocyte 22.9 %   Monocytes Relative 7.4 %   Eosinophils Relative 1.9 %   Basophils Relative 0.6 %  COMPLETE METABOLIC PANEL WITH GFR   Collection Time: 11/20/23 10:40 AM  Result Value Ref Range   Glucose, Bld 65 65 - 99 mg/dL   BUN 17 7 - 25 mg/dL   Creat 2.44 0.10 - 2.72 mg/dL   eGFR 60 > OR = 60 ZD/GUY/4.03K7   BUN/Creatinine Ratio SEE NOTE: 6 - 22 (calc)   Sodium 143 135 - 146 mmol/L   Potassium 3.9 3.5 - 5.3 mmol/L   Chloride 105 98 - 110 mmol/L   CO2 29 20 - 32 mmol/L   Calcium 9.5 8.6 - 10.4 mg/dL    Total Protein 6.8 6.1 - 8.1 g/dL   Albumin 4.3 3.6 - 5.1 g/dL   Globulin 2.5 1.9 - 3.7 g/dL (calc)   AG Ratio 1.7 1.0 - 2.5 (calc)  Total Bilirubin 0.5 0.2 - 1.2 mg/dL   Alkaline phosphatase (APISO) 50 37 - 153 U/L   AST 24 10 - 35 U/L   ALT 20 6 - 29 U/L       Assessment & Plan:   1. Amnestic MCI (mild cognitive impairment with memory loss) (Primary) Pt has consulted with neurology and is currently on aricept  Discussed pt's husband concerns and reviewed her labs and med hx extensively When she is next due for labs we will check CMP to further reassure of LFTs, reviewed ways to keep liver healthy, if they want to pay cash for additional testing that may not be medically indicated or covered by insurance we can do that with next f/up, but some of the requested tests results may have limited utility - I explained some of the tests he's asking for are for evaluating liver fibrosis and pt has never had elevated LFTs or liver disease that we know of  2. Family history of Alzheimer's disease Cont f/up with neurology   3. Primary hypertension BP stable and well controlled today on current meds, refills ordered  4. Breast cancer screening by mammogram  - MM 3D DIAGNOSTIC MAMMOGRAM BILATERAL BREAST; Future  5. Rash and nonspecific skin eruption Hyperpigmented patches to left cheek/jaw, no other sx, it has improved with hydration/vaseline Encouraged pt to continue same and if itching she could try OTC low potency hydrocortisone cream     Adeline Hone, PA-C 04/08/24 11:54 AM

## 2024-05-19 ENCOUNTER — Ambulatory Visit (INDEPENDENT_AMBULATORY_CARE_PROVIDER_SITE_OTHER): Payer: Self-pay | Admitting: Family Medicine

## 2024-05-19 ENCOUNTER — Encounter: Payer: Self-pay | Admitting: Family Medicine

## 2024-05-19 VITALS — BP 136/74 | HR 68 | Resp 16 | Ht 68.0 in | Wt 122.0 lb

## 2024-05-19 DIAGNOSIS — E785 Hyperlipidemia, unspecified: Secondary | ICD-10-CM

## 2024-05-19 DIAGNOSIS — I10 Essential (primary) hypertension: Secondary | ICD-10-CM

## 2024-05-19 DIAGNOSIS — G3184 Mild cognitive impairment, so stated: Secondary | ICD-10-CM

## 2024-05-19 DIAGNOSIS — N1831 Chronic kidney disease, stage 3a: Secondary | ICD-10-CM

## 2024-05-19 DIAGNOSIS — Z820 Family history of epilepsy and other diseases of the nervous system: Secondary | ICD-10-CM

## 2024-05-19 NOTE — Progress Notes (Signed)
 PT just did her routine f/up this appt was supposed to be cancelled and was not No appt done today

## 2024-05-19 NOTE — Patient Instructions (Signed)
 Due for mammogram which was ordered for you last office visit  [facility] at [facility] [address], [place], [place] [address] Scheduling phone #: [phone]  Health Maintenance  Topic Date Due   Pap with HPV screening  01/03/2019   Mammogram  03/19/2024   Hepatitis B Vaccine (2 of 2 - CpG 2-dose series) 05/18/2025*   Flu Shot  06/04/2024   Cologuard (Stool DNA test)  01/08/2025   DTaP/Tdap/Td vaccine (2 - Td or Tdap) 03/04/2027   Pneumococcal Vaccination  Completed   COVID-19 Vaccine  Completed   Hepatitis C Screening  Completed   HIV Screening  Completed   Zoster (Shingles) Vaccine  Completed   HPV Vaccine  Aged Out   Meningitis B Vaccine  Aged Out  *Topic was postponed. The date shown is not the original due date.

## 2024-06-01 ENCOUNTER — Ambulatory Visit
Admission: RE | Admit: 2024-06-01 | Discharge: 2024-06-01 | Disposition: A | Discharge status: Home / Self Care | Source: Ambulatory Visit | Attending: Family Medicine | Admitting: Family Medicine

## 2024-06-01 DIAGNOSIS — Z1231 Encounter for screening mammogram for malignant neoplasm of breast: Secondary | ICD-10-CM | POA: Insufficient documentation

## 2024-06-11 ENCOUNTER — Ambulatory Visit: Payer: Self-pay

## 2024-06-11 ENCOUNTER — Institutional Professional Consult (permissible substitution): Payer: 59 | Admitting: Psychology

## 2024-06-18 ENCOUNTER — Encounter: Payer: 59 | Admitting: Psychology

## 2024-08-30 ENCOUNTER — Other Ambulatory Visit: Payer: Self-pay

## 2024-08-30 ENCOUNTER — Telehealth: Payer: Self-pay

## 2024-08-30 DIAGNOSIS — I10 Essential (primary) hypertension: Secondary | ICD-10-CM

## 2024-08-30 MED ORDER — AMLODIPINE BESY-BENAZEPRIL HCL 5-10 MG PO CAPS: 1.0000 | ORAL_CAPSULE | Freq: Every day | ORAL | 0 refills | Status: DC

## 2024-08-30 NOTE — Telephone Encounter (Signed)
 Refill sent.

## 2024-08-30 NOTE — Telephone Encounter (Signed)
Pt needs a refill on amlodipine

## 2024-11-15 ENCOUNTER — Institutional Professional Consult (permissible substitution): Payer: 59 | Admitting: Psychology

## 2024-11-15 ENCOUNTER — Ambulatory Visit: Payer: Self-pay

## 2024-11-19 ENCOUNTER — Encounter: Admitting: Family Medicine

## 2024-11-22 ENCOUNTER — Encounter: Payer: 59 | Admitting: Psychology

## 2024-12-02 ENCOUNTER — Ambulatory Visit (INDEPENDENT_AMBULATORY_CARE_PROVIDER_SITE_OTHER): Admitting: Family Medicine

## 2024-12-02 ENCOUNTER — Encounter: Payer: Self-pay | Admitting: Family Medicine

## 2024-12-02 VITALS — BP 130/81 | HR 80 | Resp 16 | Ht 68.0 in | Wt 129.3 lb

## 2024-12-02 DIAGNOSIS — Z820 Family history of epilepsy and other diseases of the nervous system: Secondary | ICD-10-CM

## 2024-12-02 DIAGNOSIS — E782 Mixed hyperlipidemia: Secondary | ICD-10-CM | POA: Diagnosis not present

## 2024-12-02 DIAGNOSIS — Z7689 Persons encountering health services in other specified circumstances: Secondary | ICD-10-CM

## 2024-12-02 DIAGNOSIS — E559 Vitamin D deficiency, unspecified: Secondary | ICD-10-CM | POA: Diagnosis not present

## 2024-12-02 DIAGNOSIS — I10 Essential (primary) hypertension: Secondary | ICD-10-CM

## 2024-12-02 DIAGNOSIS — G3184 Mild cognitive impairment, so stated: Secondary | ICD-10-CM | POA: Diagnosis not present

## 2024-12-02 MED ORDER — AMLODIPINE BESY-BENAZEPRIL HCL 5-10 MG PO CAPS: 1.0000 | ORAL_CAPSULE | Freq: Every day | ORAL | 3 refills | Status: AC

## 2024-12-02 NOTE — Progress Notes (Signed)
 "  New Patient Office Visit  Subjective   Patient ID: Teresa Dodson, female    DOB: 04/24/1961  Age: 64 y.o. MRN: [id number]  CC:  Chief Complaint  Patient presents with   Transitions Of Care   Discussed the use of AI scribe software for clinical note transcription with the patient, who gave verbal consent to proceed.  History of Present Illness   Teresa Dodson is a 64 year old female who presents to establish care with [facility] and discuss her medication regimen. She is accompanied by her husband.   She is currently not taking Donepezil  due to a lapse in appointments and insurance issues. She has an upcoming neurology appointment scheduled and is waiting to restart this until then.   She is taking amlodipine -benazepril  5-10mg  daily for hypertension. Her blood pressure readings at home are usually well controlled. She recently moved to a quieter living environment, without her daughter and multiple grandchildren, which she feels has positively impacted her stress levels. This change has brought a sense of relief and reduced stress, as she now has more personal space and autonomy, positively affecting her overall well-being.  Her past medical history includes elevated cholesterol, which was last checked a year ago and was slightly elevated. She is also taking vitamin D supplements.      Outpatient Encounter Medications as of 12/02/2024  Medication Sig   amLODipine -benazepril  (LOTREL) 5-10 MG capsule Take 1 capsule by mouth daily.   cholecalciferol (VITAMIN D) 1000 units tablet Take 1,000 Units by mouth daily.   Cyanocobalamin  (VITAJOY B-12 EXTRA STR GUMMIES PO) Take 500 mcg by mouth.   GENERIC EXTERNAL MEDICATION daily. NEURO ELITE   Multiple Vitamins-Minerals (MEMORY VITE PO) Take by mouth.   Multiple Vitamins-Minerals (MULTIPLE VITAMINS/WOMENS PO) Take by mouth.   OIL OF OREGANO PO Take 6,000 mg by mouth daily.   donepezil  (ARICEPT ) 10 MG tablet Take 1  tablet (10 mg total) by mouth at bedtime. (Patient not taking: Reported on 12/02/2024)   No facility-administered encounter medications on file as of 12/02/2024.    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/06/2023   Cystadenofibroma of left ovary    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   left   History of bilateral saline breast implants 01/03/2016   History of nephrolithiasis    Hyperlipidemia 02/24/2020   Impingement syndrome of right shoulder region 09/22/2019   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   Left   Primary hypertension 06/22/2015   Stage 3a chronic kidney disease 12/10/2022   Past Surgical History:  Procedure Laterality Date   ABDOMINAL HYSTERECTOMY     vaginal; partial   AUGMENTATION MAMMAPLASTY     COLONOSCOPY WITH PROPOFOL  N/A 02/07/2022   Procedure: COLONOSCOPY WITH PROPOFOL ;  Surgeon: Therisa Bi, MD;  Location: [facility] ENDOSCOPY;  Service: Gastroenterology;  Laterality: N/A;   Family History  Problem Relation Age of Onset   Alzheimer's disease Mother        early onset   Cancer Father  lung   Alzheimer's disease Maternal Grandmother    Diabetes Paternal Grandmother    Breast cancer Paternal Aunt    Social History   Socioeconomic History   Marital status: Married    Spouse name: Daril   Number of children: 2   Years of education: 16   Highest education level: Bachelor's degree (e.g., BA, AB, BS)  Occupational History   Occupation:  Retired    Comment: Runner, Broadcasting/film/video  Tobacco Use   Smoking status: Never   Smokeless tobacco: Never  Vaping Use   Vaping status: Never Used  Substance and Sexual Activity   Alcohol use: Not Currently    Comment: socially   Drug use: No   Sexual activity: Yes    Partners: Male  Other Topics Concern   Not on file  Social History Narrative   Right handed   Drinks no caffeine   Lives with husband and her daughter and 4 kids   Not employed   One floor home   Social Drivers of Health   Tobacco Use: Low Risk (12/02/2024)   Patient History    Smoking Tobacco Use: Never    Smokeless Tobacco Use: Never    Passive Exposure: Not on file  Financial Resource Strain: Low Risk (12/24/2021)   Overall Financial Resource Strain (CARDIA)    Difficulty of Paying Living Expenses: Not hard at all  Food Insecurity: No Food Insecurity (12/24/2021)   Hunger Vital Sign    Worried About Running Out of Food in the Last Year: Never true    Ran Out of Food in the Last Year: Never true  Transportation Needs: No Transportation Needs (12/24/2021)   PRAPARE - Administrator, Civil Service (Medical): No    Lack of Transportation (Non-Medical): No  Physical Activity: Insufficiently Active (12/24/2021)   Exercise Vital Sign    Days of Exercise per Week: 3 days    Minutes of Exercise per Session: 30 min  Stress: No Stress Concern Present (12/24/2021)   [employer] of Occupational Health - Occupational Stress Questionnaire    Feeling of Stress : Not at all  Social Connections: Moderately Integrated (12/24/2021)   Social Connection and Isolation Panel    Frequency of Communication with Friends and Family: More than three times a week    Frequency of Social Gatherings with Friends and Family: More than three times a week    Attends Religious Services: More than 4 times per year    Active Member of [employer] or Organizations: No    Attends Banker Meetings: Never    Marital Status: Married   Catering Manager Violence: Not At Risk (12/24/2021)   Humiliation, Afraid, Rape, and Kick questionnaire    Fear of Current or Ex-Partner: No    Emotionally Abused: No    Physically Abused: No    Sexually Abused: No  Depression (PHQ2-9): Low Risk (12/02/2024)   Depression (PHQ2-9)    PHQ-2 Score: 0  Alcohol Screen: Low Risk (12/24/2021)   Alcohol Screen    Last Alcohol Screening Score (AUDIT): 1  Housing: Low Risk (12/24/2021)   Housing    Last Housing Risk Score: 0  Utilities: Not on file  Health Literacy: Not on file   Outpatient Medications Prior to Visit  Medication Sig Dispense Refill   amLODipine -benazepril  (LOTREL) 5-10 MG capsule Take 1 capsule by mouth daily. 90 capsule 0   cholecalciferol (VITAMIN D) 1000 units tablet Take 1,000 Units by mouth daily.     Cyanocobalamin  (VITAJOY  B-12 EXTRA STR GUMMIES PO) Take 500 mcg by mouth.     GENERIC EXTERNAL MEDICATION daily. NEURO ELITE     Multiple Vitamins-Minerals (MEMORY VITE PO) Take by mouth.     Multiple Vitamins-Minerals (MULTIPLE VITAMINS/WOMENS PO) Take by mouth.     OIL OF OREGANO PO Take 6,000 mg by mouth daily.     donepezil  (ARICEPT ) 10 MG tablet Take 1 tablet (10 mg total) by mouth at bedtime. (Patient not taking: Reported on 12/02/2024) 30 tablet 3   No facility-administered medications prior to visit.   Allergies[1]  ROS: see HPI    Objective   Today's Vitals   12/02/24 1015  BP: 130/81  Pulse: 80  Resp: 16  SpO2: 98%  Weight: 129 lb 4.8 oz (58.7 kg)  Height: 5' 8 (1.727 m)  PainSc: 0-No pain   GENERAL: Well-appearing, in NAD. Well nourished.  SKIN: Pink, warm and dry. No rash, lesion, ulceration, or ecchymoses.  Head: Normocephalic. NECK: Trachea midline. Full ROM w/o pain or tenderness. No lymphadenopathy.  EARS: Tympanic membranes are intact, translucent without bulging and without drainage. Appropriate landmarks visualized.  EYES: Conjunctiva clear without exudates. EOMI, PERRL, no drainage  present.  NOSE: Septum midline w/o deformity. Nares patent, mucosa pink and non-inflamed w/o drainage. No sinus tenderness.  THROAT: Uvula midline. Oropharynx clear. Tonsils non-inflamed without exudate. Mucous membranes pink and moist.  RESPIRATORY: Chest wall symmetrical. Respirations even and non-labored. Breath sounds clear to auscultation bilaterally.  CARDIAC: S1, S2 present, regular rate and rhythm without murmur or gallops. Peripheral pulses 2+ bilaterally.  MSK: Muscle tone and strength appropriate for age. Joints w/o tenderness, redness, or swelling.  EXTREMITIES: Without clubbing, cyanosis, or edema.  NEUROLOGIC: No motor or sensory deficits. Steady, even gait. C2-C12 intact.  PSYCH/MENTAL STATUS: Alert, oriented x 3. Cooperative, appropriate mood and affect.     Assessment & Plan:   1. Encounter to establish care (Primary) Patient is a 34- year-old female who presents today to establish care with primary care at [facility]. She is accompanied by her husband, who is her primary caregiver. Reviewed the past medical history, family history, social history, surgical history, medications and allergies today- updates made as indicated. Patient has concerns today about the following:   2. Primary hypertension Patient presents today with well-controlled blood pressure. Patient in no acute distress and is well-appearing. Denies chest pain, shortness of breath, lower extremity edema, vision changes, headaches. Cardiovascular exam with heart regular rate and rhythm. Normal heart sounds, no murmurs present. No lower extremity edema present. Lungs clear to auscultation bilaterally. Patient is currently taking amlodipine -benazepril  5-10mg  daily. Refills provided today. Advised patient to closely monitor blood, and return to office sooner if blood pressure begins to increase greater than 130/80. Follow-up for assessment of kidney function and electrolytes.  - Comprehensive metabolic panel with GFR;  Future - amLODipine -benazepril  (LOTREL) 5-10 MG capsule; Take 1 capsule by mouth daily.  Dispense: 90 capsule; Refill: 3  3. Amnestic MCI (mild cognitive impairment with memory loss) Followed by neurology. Previously on Aricept  but ran out of prescription and has not restarted this medication due to no recent neurology follow-up (last visit 11/14/2023). Upcoming appointment on 12/09/2024 for retesting with Dr. Richie and a follow-up appointment on 12/22/2024.   4. Family history of Alzheimer's disease Patient's mother was diagnosed with Alzheimer's disease.   5. Mixed hyperlipidemia Cholesterol levels slightly elevated a year ago. Plan to reassess. Ordered fasting cholesterol test. - Lipid Profile; Future  6. Vitamin D deficiency Currently on  vitamin D supplements. Plan to check levels for adequacy Ordered vitamin D level test. - VITAMIN D 25 Hydroxy (Vit-D Deficiency, Fractures); Future    Return in about 3 months (around 03/02/2025) for Physical with fasting labs.   Teresa Arts, FNP     [1]  Allergies Allergen Reactions   Sulfamethoxazole-Trimethoprim Rash   "

## 2024-12-02 NOTE — Patient Instructions (Signed)

## 2024-12-09 ENCOUNTER — Ambulatory Visit: Payer: Self-pay

## 2024-12-09 ENCOUNTER — Encounter: Payer: Self-pay | Admitting: Psychology

## 2024-12-09 ENCOUNTER — Ambulatory Visit (INDEPENDENT_AMBULATORY_CARE_PROVIDER_SITE_OTHER): Payer: Self-pay | Admitting: Psychology

## 2024-12-09 DIAGNOSIS — F03A0 Unspecified dementia, mild, without behavioral disturbance, psychotic disturbance, mood disturbance, and anxiety: Secondary | ICD-10-CM | POA: Diagnosis not present

## 2024-12-09 DIAGNOSIS — F04 Amnestic disorder due to known physiological condition: Secondary | ICD-10-CM | POA: Diagnosis not present

## 2024-12-09 DIAGNOSIS — R4189 Other symptoms and signs involving cognitive functions and awareness: Secondary | ICD-10-CM

## 2024-12-09 HISTORY — DX: Unspecified dementia, mild, without behavioral disturbance, psychotic disturbance, mood disturbance, and anxiety: F03.A0

## 2024-12-09 NOTE — Progress Notes (Signed)
 "   NEUROPSYCHOLOGICAL EVALUATION [place] Department of Neurology  Date of Evaluation: December 09, 2024  Reason for Referral:   Teresa Dodson is a 64 y.o. right-handed African-American female referred by Camie Sevin, PA-C, to characterize her current cognitive functioning and assist with diagnostic clarity and treatment planning in the context of subjective cognitive decline.   Assessment and Plan:   Clinical Impression(s): Results across neurocognitive testing are interpreted by comparing an individual's performances to those of a neurologically healthy age- and often education-matched peer group. This implies that areas of impairment, when present, represent dysfunction beyond the typical aging process. With this understanding, Teresa Dodson's pattern of performance is suggestive of cognitive impairment surrounding:  Scientist, Water Quality. Executive functioning is a cognitive domain that encompasses higher-order mental processes used to plan, organize, initiate, monitor, and regulate behavior toward goals. It includes abilities such as cognitive flexibility, inhibition, working memory, and problem-solving. These skills support self-control, decision-making, and adaptation to changing demands.  Expressive Language. Expressive language is a cognitive domain that refers to the ability to communicate thoughts, ideas, and emotions through spoken, written, or signed language. It involves using vocabulary, grammar, and sentence structure to convey meaning clearly and effectively. Specifically, Teresa Dodson exhibited primary difficulties surrounding both rapid word generation (e.g., phonemic and semantic fluency) and confrontation naming.  Learning and Memory. Short-term memory is a cognitive domain that involves the ability to briefly hold a limited amount of information in mind for immediate use. It supports tasks such as remembering instructions, numbers, or words over  a short period of time. Specifically, Teresa Dodson exhibited very Dodson deficits surrounding encoding (i.e., learning) aspects of memory and delayed retrieval of previously learned information after a short time had passed.  Additional weaknesses and/or performance variability were exhibited across:  Processing Speed. Processing speed is a cognitive domain that refers to how quickly and efficiently an individual can perceive information, perform simple mental operations, and produce a response. It reflects the pace at which the brain processes routine or well-learned tasks, especially under time pressure.  Attention/Concentration. Attention is a cognitive domain that involves the ability to focus mental resources on relevant information while sustaining concentration and managing distractions. It includes skills such as selecting important stimuli, maintaining focus over time, and shifting focus when needed.  Visuospatial/Visuoconstructional. Visuospatial and visuoconstructional abilities are cognitive domains that involve perceiving, analyzing, and mentally manipulating visual and spatial information. These skills support understanding spatial relationships, visual patterns, and the ability to construct or copy designs.  Diagnostically, Teresa Dodson is likely on the border between mild and major neurocognitive disorder classifications. Functionally, Teresa Dodson's husband noted that she is somewhat involved in medication/financial management, has no difficulty caring for her new puppy, and is able to drive without reported safety concerns. However, testing impairment, especially surrounding memory, is to the extent that it is likely impacting day-to-day functioning at least to some degree. 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 However, if functional reporting is accurate, she would be towards the mild end of this spectrum  presently.  Relative to her previous evaluation in January 2025, decline was primarily exhibited across processing speed, executive functioning, expressive language, and clock drawing. Dodson memory impairments were exhibited across both evaluations. No domain exhibited observable improvement.   Concerns remain for an underlying neurodegenerative illness, namely Alzheimer's disease. Teresa Dodson was fully amnestic (i.e. 0% retention) across all memory tasks after a brief delay  and performed quite poorly across yes/no recognition trials. This suggests rapid forgetting and a pronounced storage impairment, both of which are the hallmark testing patterns of this illness. Evidence for progressive decline over time, as well as further impairment and/or weakness surrounding executive functioning and expressive language would further follow typical disease trajectory. Concerns surrounding significant disorientation and anosognosia are also Dodson in this presentation and her family history (i.e., mother) of early onset Alzheimer's disease additionally elevates this risk. Continued monitoring will be important moving forward.   Recommendations: A lumbar puncture or blood biomarker testing could be beneficial if there was a desire for greater neuropathological confidence surrounding underlying Alzheimer's disease. Teresa Dodson could discuss these tests with Teresa Dodson if interested.   Per her husband, Teresa Dodson has not taken her donepezil  for the past 7 months. Current memory patterns with evidence for progressive decline would generally warrant consideration of this medication or another similar medication. Teresa Dodson is encouraged to set up a follow-up appointment with Teresa Dodson to discuss medication options, including restarting this medication.   Performance across neurocognitive testing is not a strong predictor of an individual's safety operating a motor vehicle. Should her family wish to pursue a formalized  driving evaluation, they could reach out to the following agencies: [employer] in [place]: [phone] [facility]: [phone] [facility]: [phone] [facility]: [phone] or [phone]  Should there be progression of current deficits over time, Teresa Dodson is unlikely to regain any independent living skills lost. Therefore, it is recommended that she remain as involved as possible in all aspects of household chores, finances, and medication management, with supervision to ensure adequate performance. She will likely benefit from the establishment and maintenance of a routine in order to maximize her functional abilities over time.  It will be important for Teresa Dodson to have another person with her when in situations where she may need to process information, weigh the pros and cons of different options, and make decisions, in order to ensure that she fully understands and recalls all information to be considered.  If not already done, Teresa Dodson and her family may want to discuss her wishes regarding durable power of attorney and medical decision making, so that she can have input into these choices. If they require legal assistance with this, long-term care resource access, or other aspects of estate planning, they could reach out to [employer] at [phone] for a free consultation. Additionally, they may wish to discuss future plans for caretaking and seek out community options for in home/residential care should they become necessary.  Teresa Dodson is encouraged to attend to lifestyle factors for brain health (e.g., regular physical exercise, good nutrition habits and consideration of the MIND-DASH diet, regular participation in cognitively-stimulating activities, and general stress management techniques), which are likely to have benefits for both emotional adjustment and cognition. Optimal control of vascular risk factors  (including safe cardiovascular exercise and adherence to dietary recommendations) is encouraged. Continued participation in activities which provide mental stimulation and social interaction is also recommended.   Important information should be provided to Teresa Dodson in written format in all instances. This information should be placed in a highly frequented and easily visible location within her home to promote recall. External strategies such as written notes in a consistently used memory journal, visual and nonverbal auditory cues such as a calendar on the refrigerator or appointments with alarm, such as on a cell phone, can also help maximize recall.  To address problems with processing speed, she may wish to consider:   -Ensuring that she is alerted when essential material or instructions are being presented   -Adjusting the speed at which new information is presented   -Allowing for more time in comprehending, processing, and responding in conversation   -Repeating and paraphrasing instructions or conversations aloud  To address problems with fluctuating attention and/or executive dysfunction, she may wish to consider:   -Avoiding external distractions when needing to concentrate   -Limiting exposure to fast paced environments with multiple sensory demands   -Writing down complicated information and using checklists   -Attempting and completing one task at a time (i.e., no multi-tasking)   -Verbalizing aloud each step of a task to maintain focus   -Taking frequent breaks during the completion of steps/tasks to avoid fatigue   -Reducing the amount of information considered at one time   -Scheduling more difficult activities for a time of day where she is usually most alert  Review of Records:   Ms. Alicia completed a comprehensive neuropsychological evaluation with myself on 11/06/2023. Results suggested Dodson impairment surrounding all aspects of learning and memory. A normative  impairment was also exhibited across receptive language, likely due to memory impairment rather than frank comprehension difficulties. Relative weaknesses were exhibited across complex attention and verbal fluency while performance variability was exhibited across executive functioning, confrontation naming, and visuospatial abilities. Performances were appropriate relative to age-matched peers across processing speed and basic attention. She was diagnosed with a mild neurocognitive disorder and concerns were expressed for an underlying neurodegenerative illness. Repeat testing was recommended.   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   Primary hypertension 06/22/2015   Stage 3a chronic kidney disease 12/10/2022    Past Surgical History:  Procedure Laterality Date   ABDOMINAL HYSTERECTOMY     vaginal; partial   AUGMENTATION MAMMAPLASTY     COLONOSCOPY WITH PROPOFOL  N/A 02/07/2022   Procedure: COLONOSCOPY WITH PROPOFOL ;  Surgeon: Therisa Bi, MD;  Location: [facility] ENDOSCOPY;  Service: Gastroenterology;  Laterality: N/A;    Current Outpatient Medications:    amLODipine -benazepril  (LOTREL) 5-10 MG capsule, Take 1 capsule by mouth daily., Disp: 90 capsule, Rfl: 3   cholecalciferol (VITAMIN D) 1000 units tablet, Take 1,000 Units by mouth daily., Disp: , Rfl:    Cyanocobalamin  (VITAJOY B-12 EXTRA STR GUMMIES PO), Take 500 mcg by mouth., Disp: , Rfl:    donepezil  (ARICEPT ) 10 MG tablet, Take 1 tablet (10 mg total) by mouth at bedtime. (Patient not taking: Reported on 12/02/2024), Disp: 30 tablet, Rfl: 3   GENERIC  EXTERNAL MEDICATION, daily. NEURO ELITE, Disp: , Rfl:    Multiple Vitamins-Minerals (MEMORY VITE PO), Take by mouth., Disp: , Rfl:    Multiple Vitamins-Minerals (MULTIPLE VITAMINS/WOMENS PO), Take by mouth., Disp: , Rfl:    OIL OF OREGANO PO, Take 6,000 mg by mouth daily., Disp: , Rfl:      12/10/2022    1:37 PM  MMSE - Mini Mental State Exam  Orientation to time 2  Orientation to Place 3  Registration 3  Attention/ Calculation 5  Recall 0  Language- name 2 objects 2  Language- repeat 1  Language- follow 3 step command 3  Language- read & follow direction 1  Write a sentence 1  Copy design 1  Total score 22      07/25/2023   11:00 AM  Montreal Cognitive Assessment   Visuospatial/ Executive (0/5) 3  Naming (0/3) 3  Attention: Read list of digits (0/2) 2  Attention: Read list of letters (0/1) 1  Attention: Serial 7 subtraction starting at 100 (0/3) 0  Language: Repeat phrase (0/2) 2  Language : Fluency (0/1) 1  Abstraction (0/2) 1  Delayed Recall (0/5) 0  Orientation (0/6) 4  Total 17   Neuroimaging: Brain MRI on 05/03/2020 suggested moderate microvascular disease.   Brain MRI on 09/02/2023 suggested moderately advanced microvascular ischemic disease, said to have somewhat progressed relative to her 2021 scan.   Clinical Interview:   The following information was obtained during a clinical interview with Teresa Dodson and her husband prior to cognitive testing.  Cognitive Symptoms: Decreased short-term memory: Denied. Previously, Teresa Dodson described generalized forgetfulness, including trouble misplacing or losing track of objects in her home. However, presently, she denied all concerns and noted her perception of improved memory abilities. Her husband previously expressed concern surrounding rapid forgetting and frequent repetition in day-to-day conversation. Presently, he reported similar concerns, while also noting his perception of mildly progressive decline since her  previous January 2025 evaluation. Overall, memory concerns have been noted since 2021.  Decreased long-term memory: Denied. Decreased attention/concentration: Denied. Her husband did express concern surrounding increased distractibility relative to previous testing.  Reduced processing speed: Denied. Difficulties with executive functions: Denied. Difficulties with emotion regulation: Denied. They also denied any prominent personality changes.  Difficulties with receptive language: Denied. Difficulties with word finding: Denied. Decreased visuoperceptual ability: Denied.  Difficulties completing ADLs: Somewhat. Her husband is involved with medication and financial management; however, some of this appears more longstanding in nature. He highlighted that Teresa Dodson has a puppy at home and has no issue caring for it. She continues to drive and they both denied any safety concerns.   Additional Medical History: History of traumatic brain injury/concussion: Denied. History of stroke: Denied. History of seizure activity: Denied. History of known exposure to toxins: Denied. Symptoms of chronic pain: Denied. Experience of frequent headaches/migraines: Denied. Frequent instances of dizziness/vertigo: Denied.   Sensory changes: She wears glasses with benefit. Other sensory changes/difficulties (e.g., hearing, taste, smell) were denied. Her husband previously noted an increased sweet tooth. This has persisted.  Balance/coordination difficulties: Denied. She also denied any recent falls.  Other motor difficulties: Denied.  Sleep History: Estimated hours obtained each night: 8 hours.  Difficulties falling asleep: Denied. Difficulties staying asleep: Denied. Feels rested and refreshed upon awakening: Endorsed.   History of snoring: Denied. History of waking up gasping for air: Denied. Witnessed breath cessation while asleep: Denied.   History of vivid dreaming: Denied. Excessive movement while  asleep: Denied. Instances of acting out her dreams: Denied.  Psychiatric/Behavioral Health History: Depression: She described her current mood as good and denied to her knowledge any prior mental health concerns or formal diagnoses. Her husband noted that she has seemed calmer and less stressed following her daughter and four grandchildren no longer residing with them as was the case at the time of her previous evaluation. Current or remote suicidal ideation, intent, or plan was denied.  Anxiety: Denied. Mania: Denied. Trauma History: Denied. Visual/auditory hallucinations: Denied. Delusional thoughts: Denied.   Tobacco: Denied. Alcohol:  She reported very infrequent alcohol consumption largely in social settings and denied a history of problematic alcohol abuse or dependence.  Recreational drugs: Denied.  Family History: Problem Relation Age of Onset   Alzheimer's disease Mother        early onset   Cancer Father        lung   Alzheimer's disease Maternal Grandmother    Diabetes Paternal Grandmother    Breast cancer Paternal Aunt    This information was confirmed by Teresa Dodson.  Academic/Vocational History: Highest level of educational attainment: 16 years. She earned a Oncologist in education and described herself as a strong (A/B) student in academic settings. Allison perhaps represented a relative weakness.  History of developmental delay: Denied. History of grade repetition: Denied. Enrollment in special education courses: Denied. History of LD/ADHD: Denied.   Employment: Retired. She previously worked as a building services engineer.  Evaluation Results:   Behavioral Observations: Ms. Wallman was accompanied by her husband, arrived to her appointment on time, and was appropriately dressed and groomed. She appeared alert. Observed gait and station were within normal limits. Gross motor functioning appeared intact upon informal observation and no abnormal  movements (e.g., tremors) were noted. Her affect was generally relaxed and positive. Spontaneous speech was fluent and word finding difficulties were not observed during the clinical interview. Thought processes were coherent, organized, and normal in content. Insight into her cognitive difficulties appeared poor and I do have concern that she is unable to appreciate the extent of cognitive dysfunction, especially short-term memory impairment.   During testing, sustained attention was appropriate. Task engagement was adequate and she persisted when challenged. Overall, Ms. Burson was cooperative with the clinical interview and subsequent testing procedures.   Adequacy of Effort: The validity of neuropsychological testing is limited by the extent to which the individual being tested may be assumed to have exerted adequate effort during testing. Ms. Lineman expressed her intention to perform to the best of her abilities and exhibited adequate task engagement and persistence. Scores across stand-alone and embedded performance validity measures were within expectation. As such, the results of the current evaluation are believed to be a valid representation of Ms. Janicki's current cognitive functioning.  Test Results: Ms. Fiorenza was very disoriented at the time of the current evaluation. She incorrectly stated her age (34) and was unable to provide a current address or phone number. She was also unable to state the current year (2022), month, date, or time. She was further unable to name the current clinic or identify the city it is located in.   Intellectual abilities based upon educational and vocational attainment were estimated to be in the average range. Premorbid abilities were estimated to be within the average range based upon a single-word reading test.   Processing speed was variable, ranging from the exceptionally low to average normative ranges. Basic attention was below average to average.  More complex attention (e.g., working memory) was variable, ranging from the exceptionally low to below average normative ranges. Executive functioning was exceptionally low to well below average.  Assessed receptive language abilities were exceptionally low. She had prominent trouble sequencing commands and following multi-step commands, suggesting that memory impairment may play a larger role in poor performances across this task. Ms. Rominger did not exhibit any difficulties comprehending task instructions and answered all questions asked of her appropriately. Assessed expressive language (e.g., verbal fluency and confrontation naming) was largely exceptionally low to well below average.   Assessed visuospatial/visuoconstructional  abilities were variable, ranging from the exceptionally low to average normative ranges. Across her drawing of a clock, she omitted the number 1 and placed the numbers in counter-clockwise fashion. She also incorrectly placed the clock hands.      Learning (i.e., encoding) of novel information was exceptionally low. Spontaneous delayed recall (i.e., retrieval) of previously learned information was also exceptionally low. Retention rates were 0% across a list learning task, 0% across a story learning task, and 0% across a figure drawing task. Performance across recognition tasks was exceptionally low to well below average, suggesting negligible evidence for information consolidation.   Results of emotional screening instruments suggested that recent symptoms of generalized anxiety were in the minimal range, while symptoms of depression were within normal limits. A screening instrument assessing recent sleep quality suggested the presence of minimal sleep dysfunction.  Table of Scores:   Note: This summary of test scores accompanies the interpretive report and should not be considered in isolation without reference to the appropriate sections in the text. Descriptors are based  on appropriate normative data and may be adjusted based on clinical judgment. Terms such as Within Normal Limits and Outside Normal Limits are used when a more specific description of the test score cannot be determined. Descriptors refer to the current evaluation only.        Percentile - Normative Descriptor > 98 - Exceptionally High 91-97 - Well Above Average 75-90 - Above Average 25-74 - Average 9-24 - Below Average 2-8 - Well Below Average < 2 - Exceptionally Low        Validity: January 2025 Current  DESCRIPTOR        ACS WC: --- --- --- Outside Normal Limits  DCT: --- --- --- Within Normal Limits  RBANS EI: --- --- --- Outside Normal Limits  WAIS-IV RDS: --- --- --- Within Normal Limits        Orientation:       Raw Score Raw Score Percentile   NAB Orientation, Form 1 21/29 13/29 --- ---        Cognitive Screening:       Raw Score Raw Score Percentile   SLUMS: 14/30 7/30 --- ---        RBANS, Form A: Standard Score/ Scaled Score Standard Score/ Scaled Score Percentile   Total Score 67 59 <1 Exceptionally Low  Immediate Memory 53 40 <1 Exceptionally Low    List Learning 3 1 <1 Exceptionally Low    Story Memory 2 1 <1 Exceptionally Low  Visuospatial/Constructional 89 92 30 Average    Figure Copy 11 11 63 Average    Line Orientation 12/20 13/20 10-16 Below Average  Language 92 82 12 Below Average    Picture Naming 10/10 9/10 17-25 Below Average to  Average    Semantic Fluency 7 3 1  Exceptionally Low  Attention 94 85 16 Below Average    Digit Span 8 7 16  Below Average    Coding 10 8 25  Average  Delayed Memory 40 40 <1 Exceptionally Low    List Recall 0/10 0/10 <2 Exceptionally Low    List Recognition 11/20 12/20 <2 Exceptionally Low    Story Recall 1 1 <1 Exceptionally Low    Story Recognition 7/12 6/12 3-4 Well Below Average    Figure Recall 1 1 <1 Exceptionally Low    Figure Recognition 2/8 1/8 1 Exceptionally Low        Intellectual Functioning:        Standard Score Standard Score  Percentile   Test of Premorbid Functioning: 90 96 39 Average        Attention/Executive Function:      Trail Making Test (TMT): Raw Score (T Score) Raw Score (T Score) Percentile     Part A 25 secs.,  0 errors (56) 43 secs.,  0 errors (44) 27 Average    Part B 77 secs.,  0 errors (52) 238 secs.,  3 errors (31) 3 Well Below Average          Scaled Score Scaled Score Percentile   WAIS-IV Digit Span: 6 5 5  Well Below Average    Forward 8 8 25  Average    Backward 7 7 16  Below Average    Sequencing 5 3 1  Exceptionally Low         Scaled Score Scaled Score Percentile   WAIS-IV Similarities: 7 3 1  Exceptionally Low        D-KEFS Color-Word Interference Test: Raw Score (Scaled Score) Raw Score (Scaled Score) Percentile     Color Naming 30 secs. (11) 57 secs. (1) <1 Exceptionally Low    Word Reading 25 secs. (10) 42 secs. (1) <1 Exceptionally Low    Inhibition 101 secs. (4) 72 secs. (9) 37 Average      Total Errors 5 errors (6) 28 errors (1) <1 Exceptionally Low    Inhibition/Switching 155 secs. (1) Discontinued --- Impaired      Total Errors 20 errors (1) --- --- ---        D-KEFS Verbal Fluency Test: Raw Score (Scaled Score) Raw Score (Scaled Score) Percentile     Letter Total Correct 25 (7) 18 (5) 5 Well Below Average    Category Total Correct 25 (6) 10 (1) <1 Exceptionally Low    Category Switching Total Correct 9 (6) 7 (3) 1 Exceptionally Low    Category Switching Accuracy 8 (7) 6 (5) 5 Well Below Average      Total Set Loss Errors 3 (9) 3 (9) 37 Average      Total Repetition Errors 4 (9) 4 (9) 37 Average        [place]  Card Sorting Test: Raw Score Raw Score Percentile     Categories (trials) 1 (64) 0 (64) 2-5 Well Below Average    Total Errors 35 44 1 Exceptionally Low    Perseverative Errors 11 11 18  Below Average    Non-Perseverative Errors 24 33 <1 Exceptionally Low    Failure to Maintain Set 0 0 --- ---        Language:      Verbal  Fluency Test: Raw Score (T Score) Raw Score (T Score) Percentile     Phonemic Fluency (FAS) 25 (36) 18 (32) 4 Well Below Average    Animal Fluency 12 (39) 6 (23) <1 Exceptionally Low         NAB Language Module, Form 1: T Score T Score Percentile     Auditory Comprehension 19 19 <1 Exceptionally Low    Naming 26/31 (24) 24/31 (19) <1 Exceptionally Low        Visuospatial/Visuoconstruction:       Raw Score Raw Score Percentile   Clock Drawing: 9/10 4/10 --- Impaired         Scaled Score Scaled Score Percentile   WAIS-IV Block Design: 7 7 16  Below Average        Mood and Personality:       Raw Score Raw Score Percentile   Beck Depression Inventory - II: 1  0 --- Within Normal Limits  PROMIS Anxiety Questionnaire: 7 12 --- None to Slight        Additional Questionnaires:       Raw Score Raw Score Percentile   PROMIS Sleep Disturbance Questionnaire: 8 9 --- None to Slight   Informed Consent and Coding/Compliance:   The current evaluation represents a clinical evaluation for the purposes previously outlined by the referral source and is in no way reflective of a forensic evaluation.   Ms. Henrickson was provided with a verbal description of the nature and purpose of the present neuropsychological evaluation. Also reviewed were the foreseeable risks and/or discomforts and benefits of the procedure, limits of confidentiality, and mandatory reporting requirements of this provider. The patient was given the opportunity to ask questions and receive answers about the evaluation. Oral consent to participate was provided by the patient.   This evaluation was conducted by Arthea KYM Maryland, Ph.D., ABPP-CN, board certified clinical neuropsychologist. Ms. Rettinger completed a clinical interview with Dr. Maryland, billed as one unit [phone], and 150 minutes of cognitive testing and scoring, billed as one unit [phone] and four additional units [id number]. Psychometrist Evalene Pizza, B.S. assisted Dr. Maryland with test  administration and scoring procedures. As a separate and discrete service, one unit [phone] and two units 96133 (166 minutes) were billed for Dr. Loralee time spent in interpretation and report writing.      "

## 2024-12-09 NOTE — Progress Notes (Addendum)
" ° °  Psychometrician Note   Cognitive testing was administered to Teresa Dodson by Evalene Pizza, B.S. (psychometrist) under the supervision of Dr. Arthea KYM Maryland, Ph.D., ABPP, licensed psychologist on 12/09/2024. Teresa Dodson did not appear overtly distressed by the testing session per behavioral observation or responses across self-report questionnaires. Rest breaks were offered.   The battery of tests administered was selected by Dr. Zachary C. Merz, Ph.D., ABPP with consideration to Teresa Dodson's current level of functioning, the nature of her symptoms, emotional and behavioral responses during interview, level of literacy, observed level of motivation/effort, and the nature of the referral question. This battery was communicated to the psychometrist. Communication between Dr. Arthea KYM Maryland, Ph.D., ABPP and the psychometrist was ongoing throughout the evaluation and Dr. Arthea KYM Maryland, Ph.D., ABPP was immediately accessible at all times. Dr. Zachary C. Merz, Ph.D., ABPP provided supervision to the psychometrist on the date of this service to the extent necessary to assure the quality of all services provided.    Teresa Dodson will return within approximately 1-2 weeks for an interactive feedback session with Dr. Maryland at which time her test performances, clinical impressions, and treatment recommendations will be reviewed in detail. Teresa Dodson understands she can contact our office should she require our assistance before this time.  A total of 150 minutes of billable time were spent face-to-face with Teresa Dodson by the psychometrist. This includes both test administration and scoring time. Billing for these services is reflected in the clinical report generated by Dr. Arthea KYM Maryland, Ph.D., ABPP  This note reflects time spent with the psychometrician and does not include test scores or any clinical interpretations made by Dr. Maryland. The full report will follow in a separate note. "

## 2024-12-22 ENCOUNTER — Encounter: Payer: Self-pay | Admitting: Psychology

## 2025-02-23 ENCOUNTER — Ambulatory Visit

## 2025-03-02 ENCOUNTER — Encounter: Admitting: Family Medicine
